# Patient Record
Sex: Male | Born: 1950 | Race: White | Hispanic: No | Marital: Married | Smoking: Never smoker
Health system: Southern US, Community
[De-identification: ages and names within clinical notes are randomized; demographics above are authoritative.]

## PROBLEM LIST (undated history)

## (undated) DIAGNOSIS — N289 Disorder of kidney and ureter, unspecified: Secondary | ICD-10-CM

## (undated) DIAGNOSIS — H409 Unspecified glaucoma: Secondary | ICD-10-CM

## (undated) DIAGNOSIS — C801 Malignant (primary) neoplasm, unspecified: Secondary | ICD-10-CM

## (undated) DIAGNOSIS — E079 Disorder of thyroid, unspecified: Secondary | ICD-10-CM

## (undated) DIAGNOSIS — I10 Essential (primary) hypertension: Secondary | ICD-10-CM

## (undated) HISTORY — PX: PARATHYROIDECTOMY: SHX19

## (undated) HISTORY — PX: NEPHRECTOMY: SHX65

---

## 2005-05-12 ENCOUNTER — Ambulatory Visit: Payer: Self-pay | Admitting: General Surgery

## 2012-03-13 ENCOUNTER — Emergency Department: Payer: Self-pay | Admitting: Internal Medicine

## 2012-03-13 LAB — CBC
HCT: 48.5 % (ref 40.0–52.0)
HGB: 16.6 g/dL (ref 13.0–18.0)
MCV: 89 fL (ref 80–100)
Platelet: 250 10*3/uL (ref 150–440)
RBC: 5.45 10*6/uL (ref 4.40–5.90)
RDW: 13.8 % (ref 11.5–14.5)

## 2012-03-13 LAB — URINALYSIS, COMPLETE
Bilirubin,UR: NEGATIVE
Ketone: NEGATIVE
Leukocyte Esterase: NEGATIVE
Nitrite: NEGATIVE
Ph: 6 (ref 4.5–8.0)
Protein: 100
RBC,UR: 449 /HPF (ref 0–5)
Specific Gravity: 1.027 (ref 1.003–1.030)
Squamous Epithelial: NONE SEEN

## 2012-03-13 LAB — BASIC METABOLIC PANEL
Anion Gap: 9 (ref 7–16)
Calcium, Total: 10.4 mg/dL — ABNORMAL HIGH (ref 8.5–10.1)
Chloride: 108 mmol/L — ABNORMAL HIGH (ref 98–107)
Co2: 22 mmol/L (ref 21–32)
Osmolality: 284 (ref 275–301)
Potassium: 3.9 mmol/L (ref 3.5–5.1)

## 2012-03-19 ENCOUNTER — Emergency Department: Payer: Self-pay | Admitting: Emergency Medicine

## 2014-02-01 IMAGING — CT CT STONE STUDY
1 of 2 series · 15 of 32 positions shown, 19 images · non-contrast
Comparison: None

REASON FOR EXAM: abd pain
COMMENTS:

PROCEDURE:     CT  - CT ABDOMEN /PELVIS WO (STONE)  - March 13, 2012 [DATE]
RESULT:     Indication: Flank Pain
TECHNIQUE: Multiple axial images from the lung bases to the symphysis pubis
were obtained without oral and without intravenous contrast.

[Series 2: 3mm soft tissue · axial · 0.71mm/px · z∈[-1198,-754]mm · 15 of 162 slices shown, 19 images]
[im 7/162  soft-tissue]
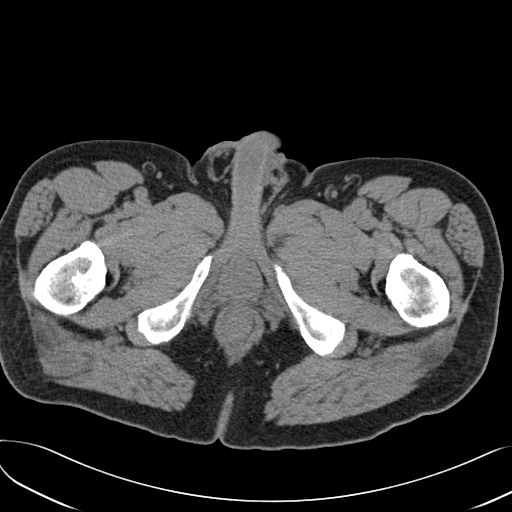
[im 7/162  bone]
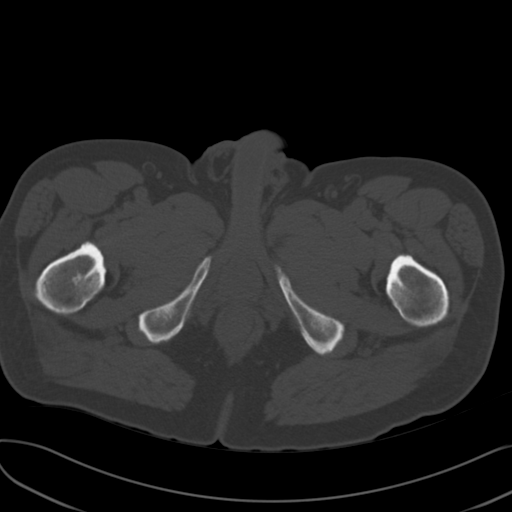
[im 20/162  soft-tissue]
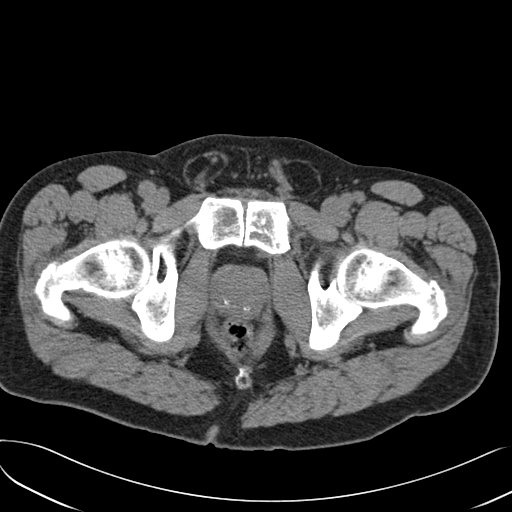
[im 33/162  soft-tissue]
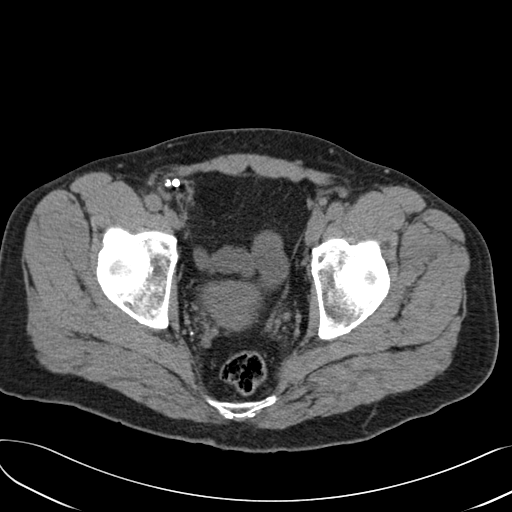
[im 46/162  soft-tissue]
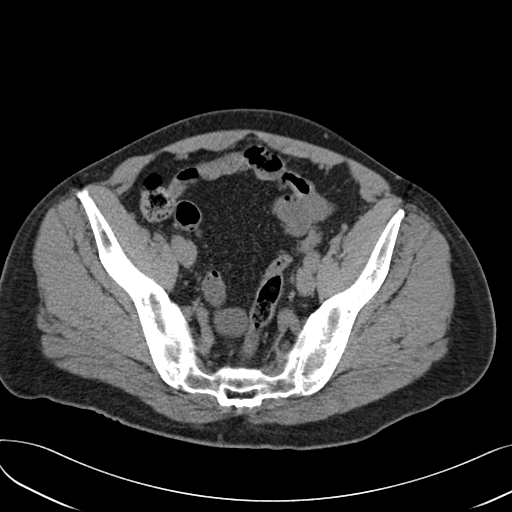
[im 58/162  soft-tissue]
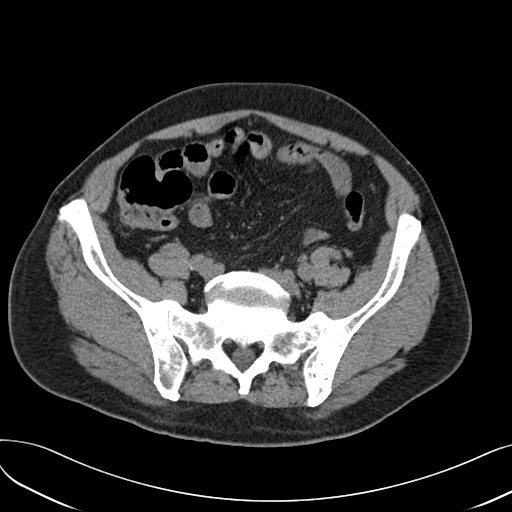
[im 71/162  soft-tissue]
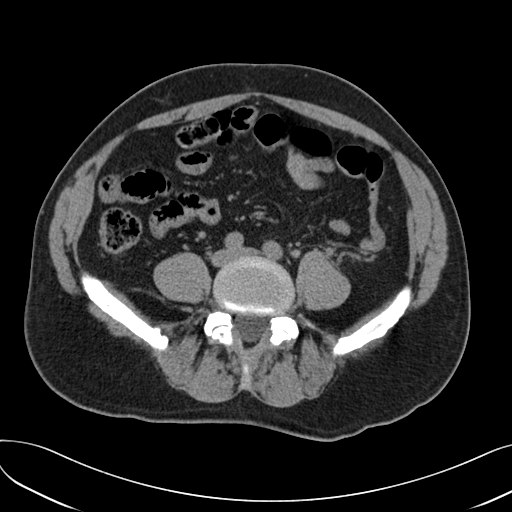
[im 84/162  soft-tissue]
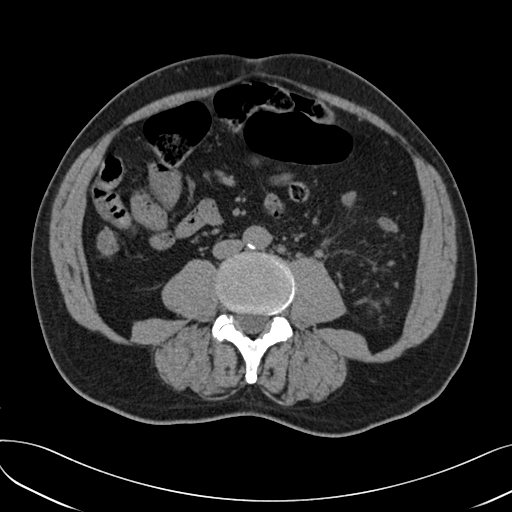
[im 91/162  soft-tissue]
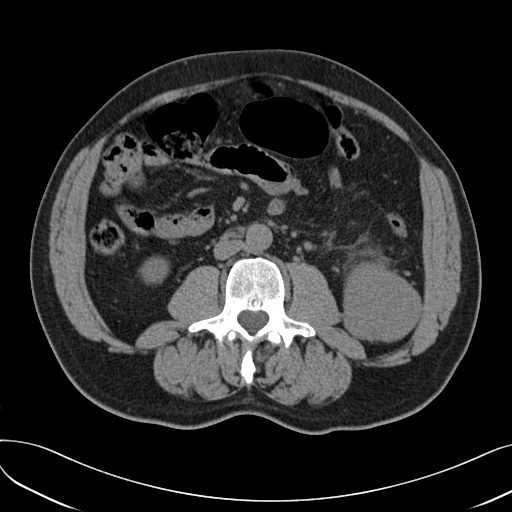
[im 104/162  soft-tissue]
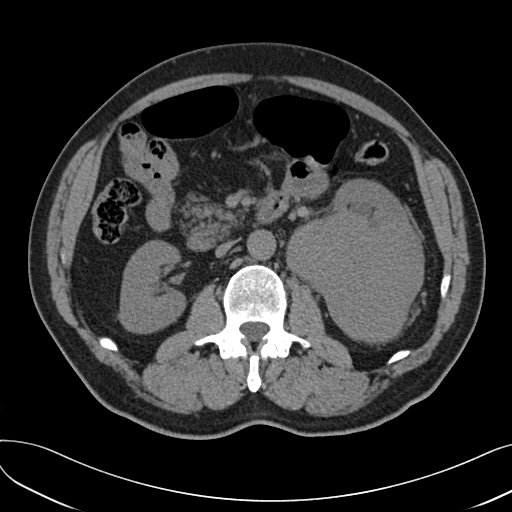
[im 104/162  bone]
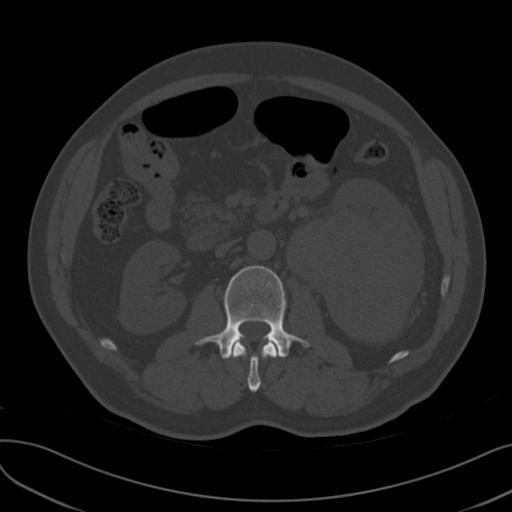
[im 116/162  soft-tissue]
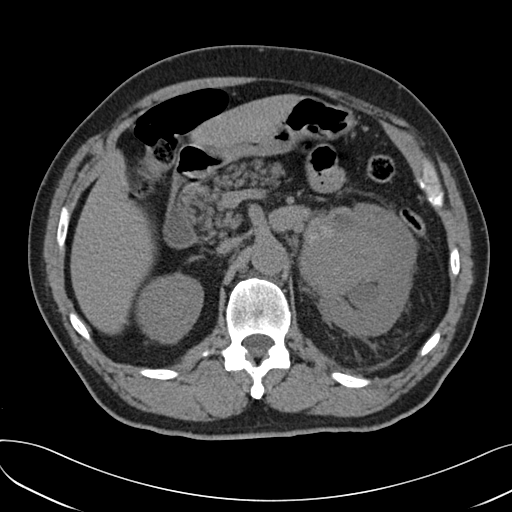
[im 129/162  soft-tissue]
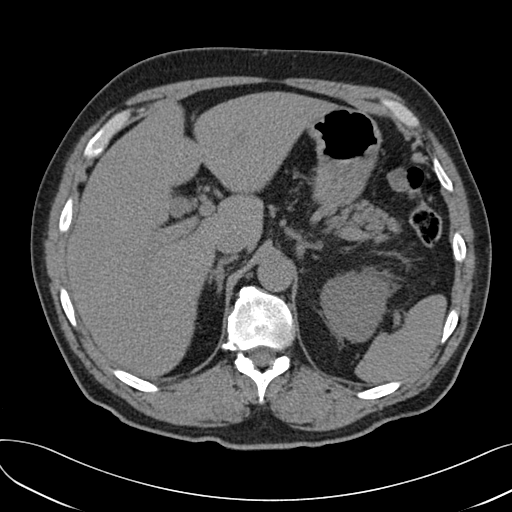
[im 136/162  lung]
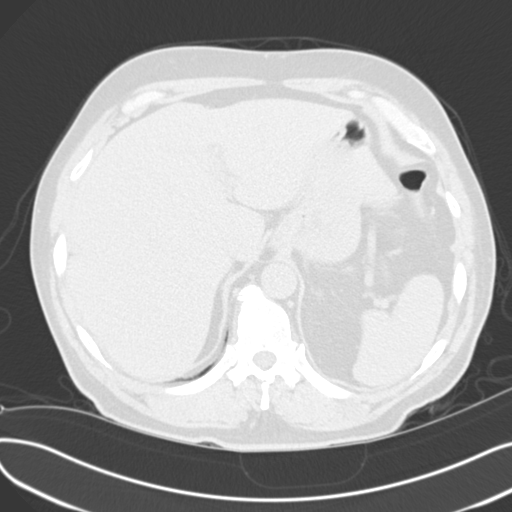
[im 142/162  soft-tissue]
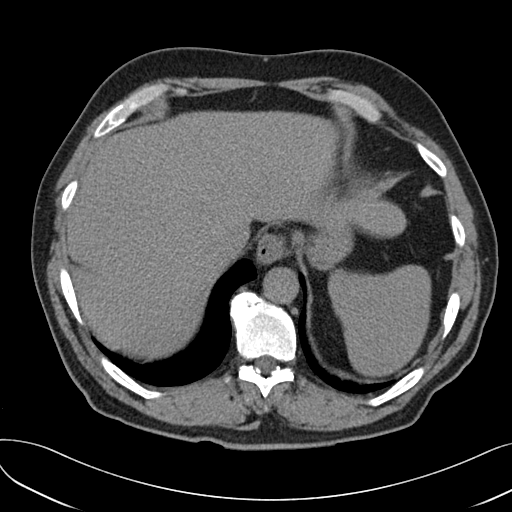
[im 142/162  lung]
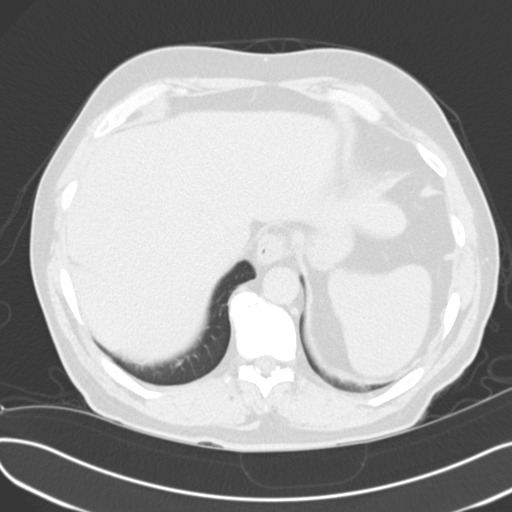
[im 149/162  lung]
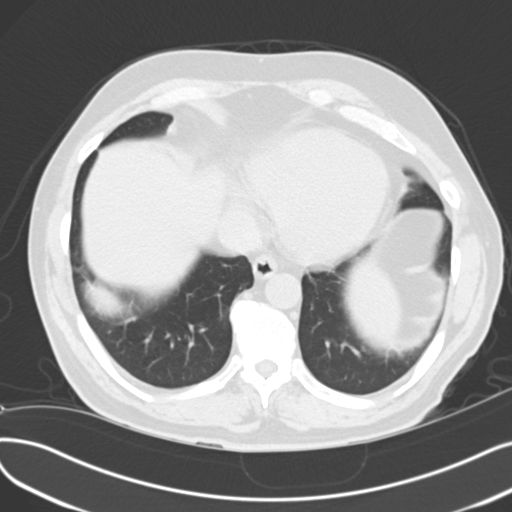
[im 155/162  soft-tissue]
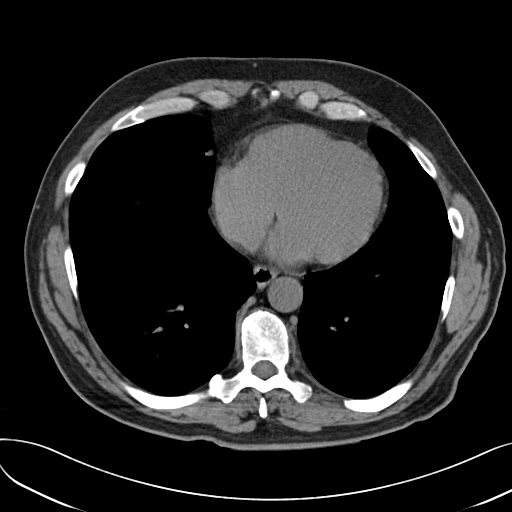
[im 155/162  lung]
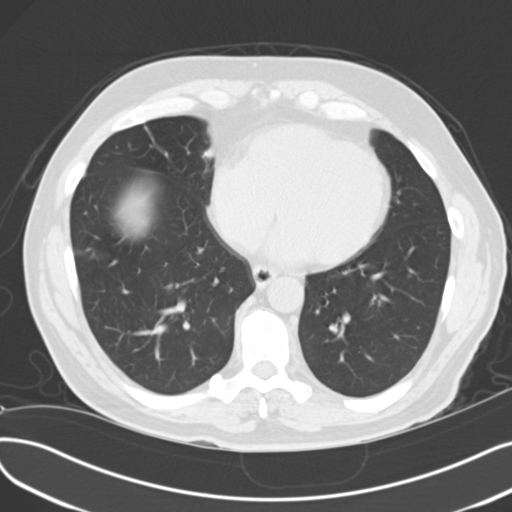

[15 of 32 positions shown; findings below may reference images not displayed]

FINDINGS: The lung bases are clear. There is no pleural or pericardial effusions.

There is left perinephric stranding. There is a 5.3 cm mass in the left
renal pelvis. There is resulting severe left hydronephrosis. There is no
urolithiasis. The bladder is unremarkable.

The liver demonstrates no focal abnormality. The gallbladder is
unremarkable. The spleen demonstrates no focal abnormality. The adrenal
glands and pancreas are normal.

The unopacified stomach, duodenum, small intestine, and large intestine are
unremarkable, but evaluation is limited by lack of oral contrast.  There is
no pneumoperitoneum, pneumatosis, or portal venous gas. There is no
abdominal or pelvic free fluid. There is no lymphadenopathy.

The abdominal aorta is normal in caliber with atherosclerosis.

The osseous structures are unremarkable.
IMPRESSION: 1. Large the left renal mass which may be arising from the renal cortex
versus renal pelvis. Differential considerations include renal cell
carcinoma versus transitional cell carcinoma. Recommend urology consultation.

[REDACTED]

## 2014-02-07 IMAGING — CR DG ABDOMEN 3V
1 series · 5 of 5 positions shown · non-contrast
Comparison: none

REASON FOR EXAM: abd pain
COMMENTS:

[Series 1: w chest pa · 0.14mm/px · 5 of 5 slices shown]
[im 1/5]
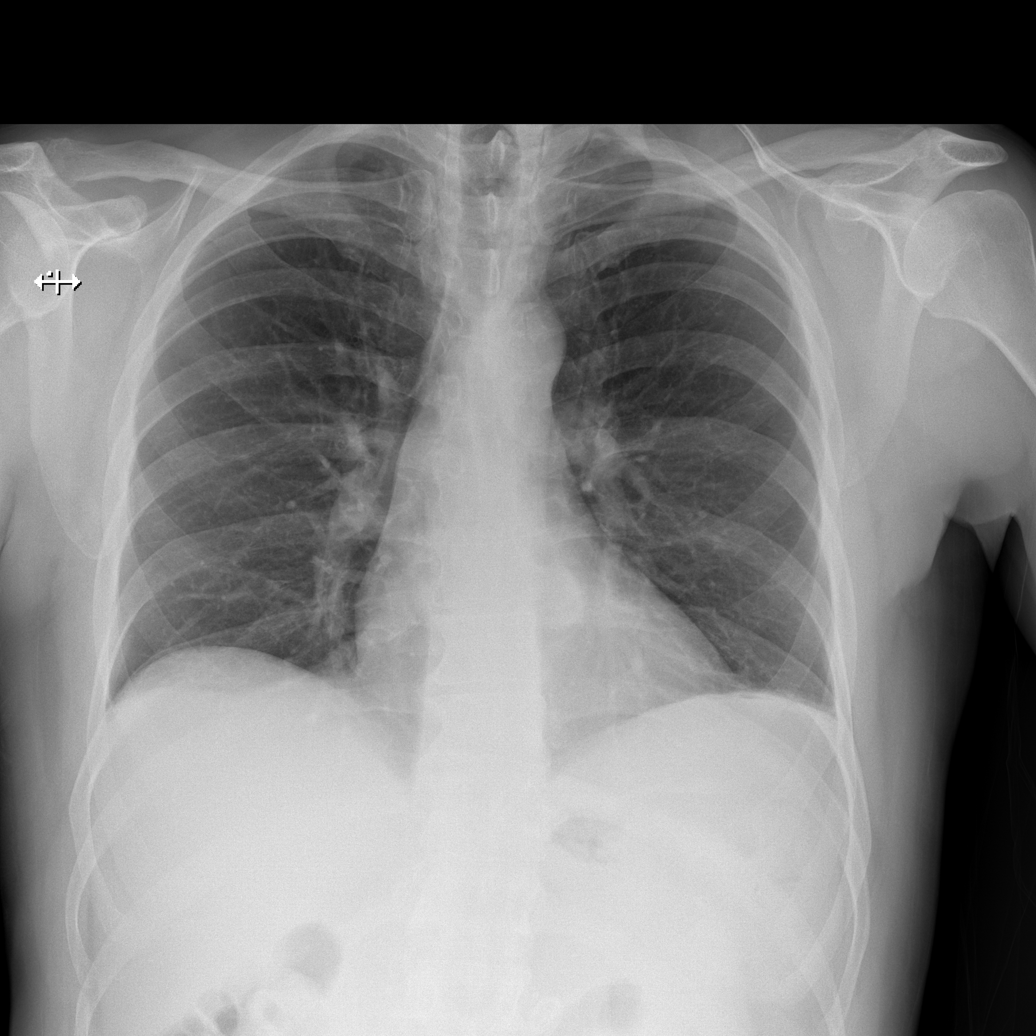
[im 2/5]
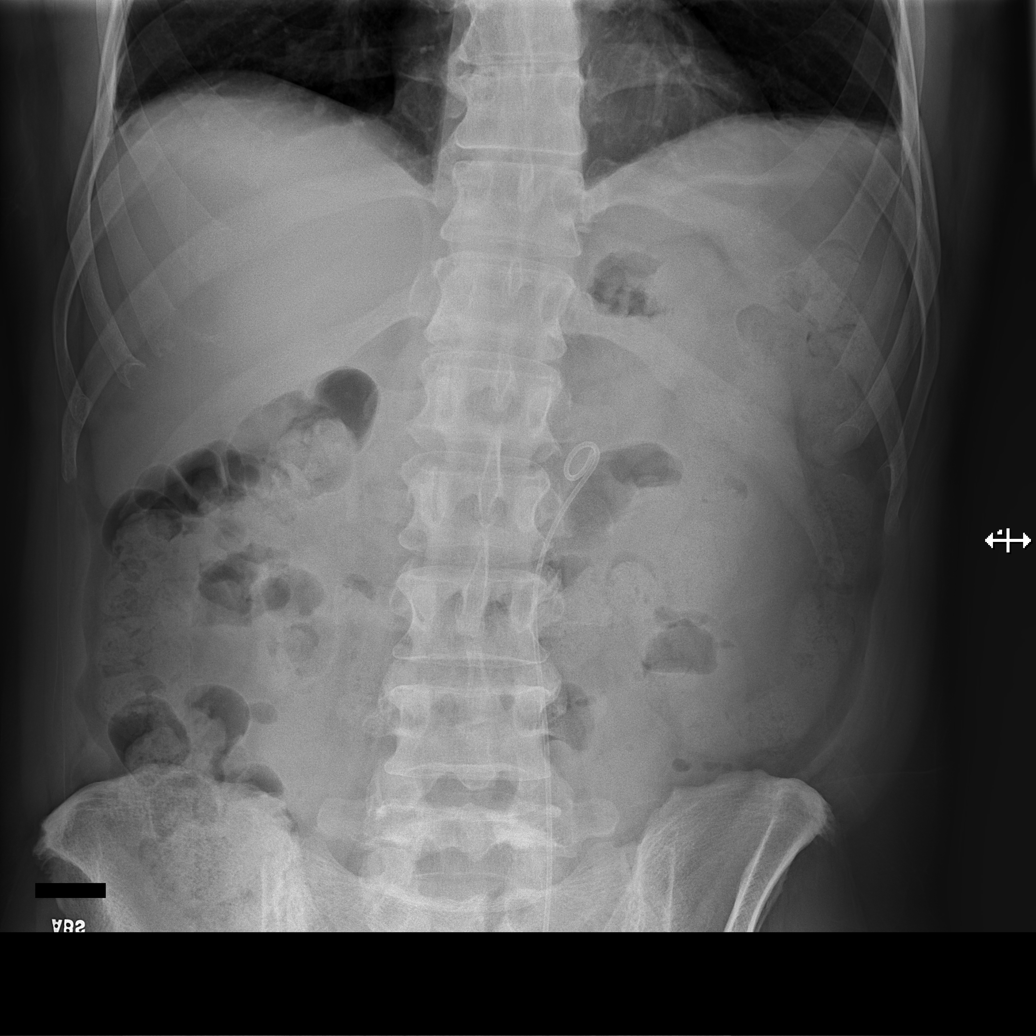
[im 3/5]
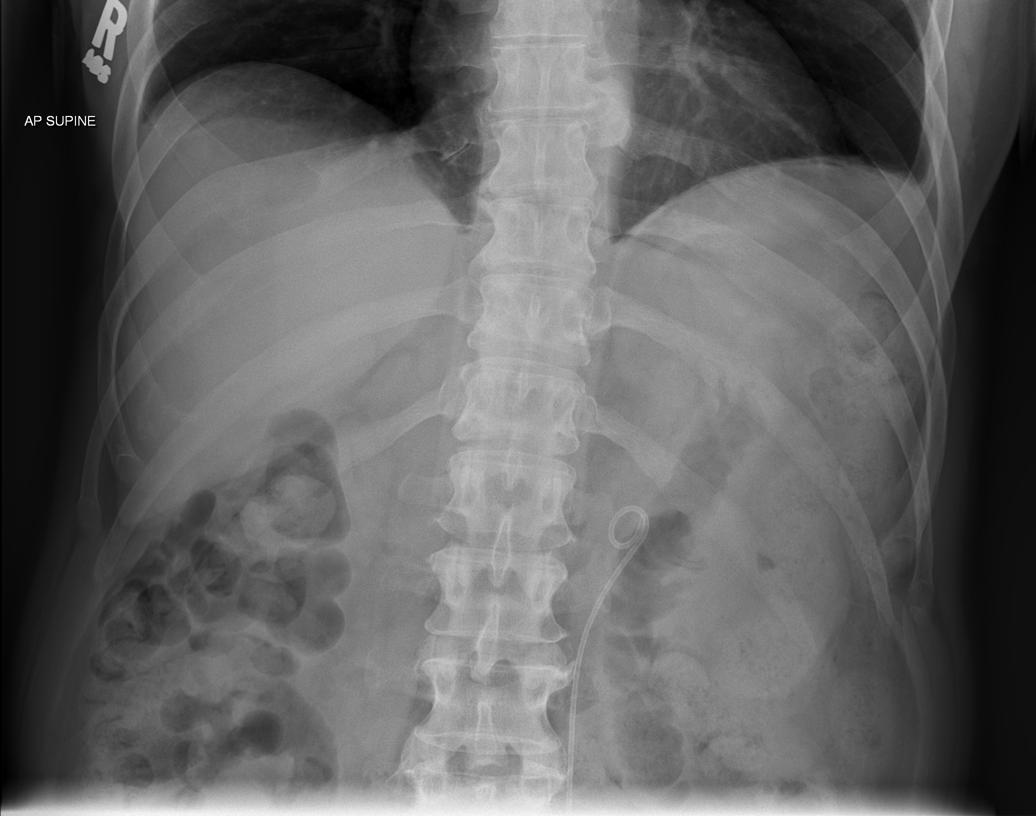
[im 4/5]
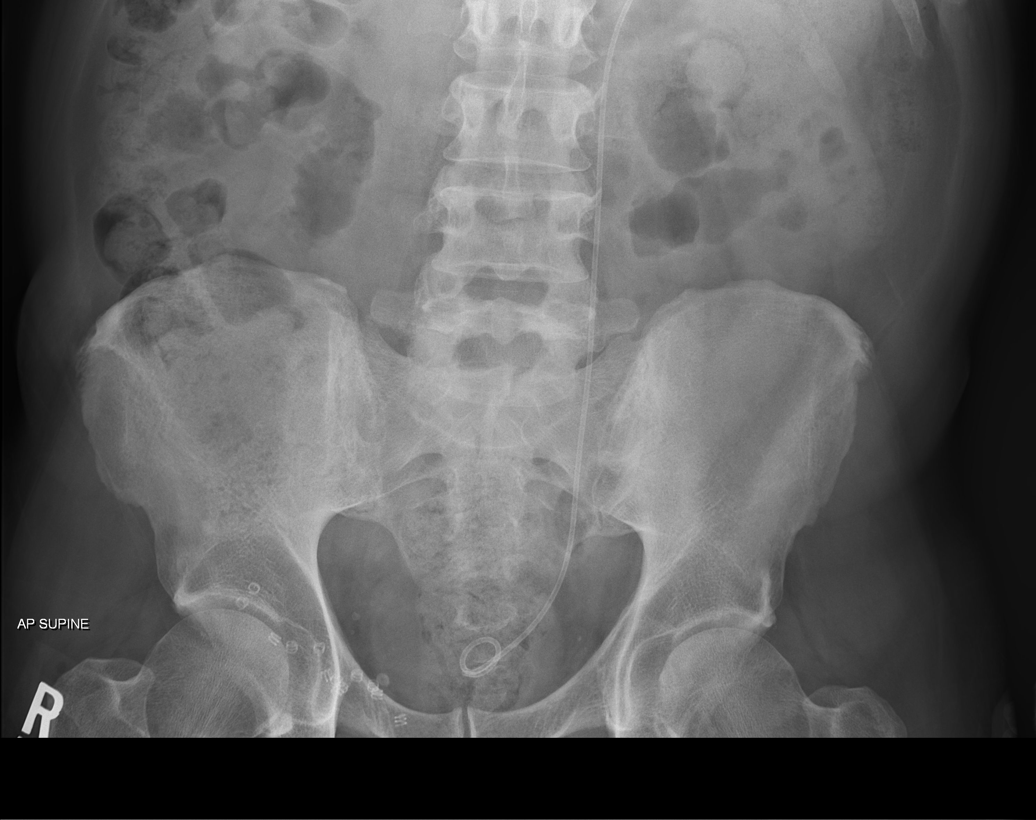
[im 5/5]
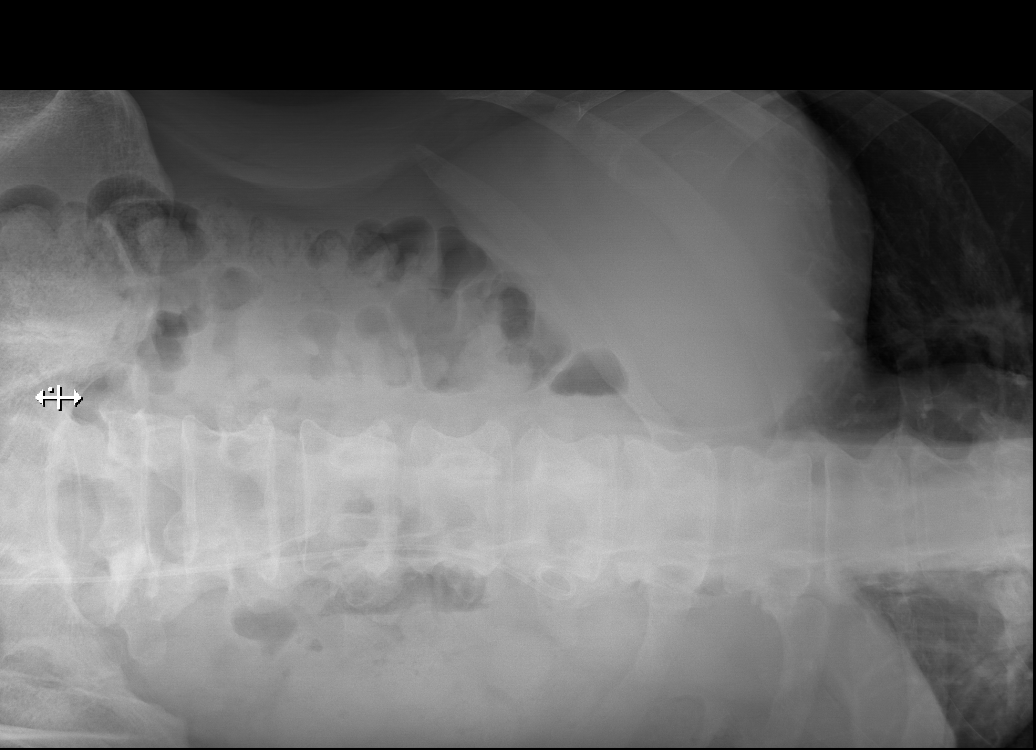

[5 of 5 positions shown; findings below may reference images not displayed]

PROCEDURE:     DXR - DXR ABDOMEN 3-WAY (INCL PA CXR)  - March 19, 2012  [DATE]

RESULT:     The lungs are clear. The heart and pulmonary vessels are normal.
There does not appear to be significant gastric distention. A double-J left
ureteral stent is in position. There is a moderate amount of air and fecal
material scattered through the colon to the rectum. There is air within
slightly prominent loops of small bowel with some air-fluid levels. No free
air is evident. Bony structures are unremarkable. There is a calcific
density along the course of the left ureteral stent at the L5 level which
could represent a portion of the spine. A left ureteral stone is not
completely excluded.
IMPRESSION: Air seen within loops of small bowel with a few air-fluid
levels present. Findings are concerning most likely for ileus. An early
partial small bowel obstruction is not excluded. Clinical and imaging
followup would be recommended.

[REDACTED]

## 2017-10-19 ENCOUNTER — Other Ambulatory Visit
Admission: RE | Admit: 2017-10-19 | Discharge: 2017-10-19 | Disposition: A | Discharge status: Home / Self Care | Payer: Managed Care, Other (non HMO) | Source: Ambulatory Visit | Attending: Pediatrics | Admitting: Pediatrics

## 2017-10-19 ENCOUNTER — Ambulatory Visit
Admission: RE | Admit: 2017-10-19 | Discharge: 2017-10-19 | Disposition: A | Discharge status: Home / Self Care | Payer: Managed Care, Other (non HMO) | Source: Ambulatory Visit | Attending: Family Medicine | Admitting: Family Medicine

## 2017-10-19 ENCOUNTER — Other Ambulatory Visit: Payer: Self-pay | Admitting: Family Medicine

## 2017-10-19 DIAGNOSIS — M7989 Other specified soft tissue disorders: Secondary | ICD-10-CM | POA: Diagnosis not present

## 2017-10-19 LAB — FIBRIN DERIVATIVES D-DIMER (ARMC ONLY): Fibrin derivatives D-dimer (ARMC): 834.5 ng/mL (FEU) — ABNORMAL HIGH (ref 0.00–499.00)

## 2018-09-29 ENCOUNTER — Other Ambulatory Visit: Payer: Self-pay

## 2018-09-29 ENCOUNTER — Emergency Department: Payer: Managed Care, Other (non HMO)

## 2018-09-29 ENCOUNTER — Encounter: Payer: Self-pay | Admitting: Emergency Medicine

## 2018-09-29 ENCOUNTER — Emergency Department
Admission: EM | Admit: 2018-09-29 | Discharge: 2018-09-29 | Disposition: A | Discharge status: Home / Self Care | Payer: Managed Care, Other (non HMO) | Attending: Emergency Medicine | Admitting: Emergency Medicine

## 2018-09-29 DIAGNOSIS — I10 Essential (primary) hypertension: Secondary | ICD-10-CM | POA: Insufficient documentation

## 2018-09-29 DIAGNOSIS — E860 Dehydration: Secondary | ICD-10-CM | POA: Diagnosis not present

## 2018-09-29 DIAGNOSIS — R109 Unspecified abdominal pain: Secondary | ICD-10-CM | POA: Diagnosis not present

## 2018-09-29 DIAGNOSIS — R079 Chest pain, unspecified: Secondary | ICD-10-CM | POA: Insufficient documentation

## 2018-09-29 HISTORY — DX: Unspecified glaucoma: H40.9

## 2018-09-29 HISTORY — DX: Disorder of kidney and ureter, unspecified: N28.9

## 2018-09-29 HISTORY — DX: Malignant (primary) neoplasm, unspecified: C80.1

## 2018-09-29 HISTORY — DX: Disorder of thyroid, unspecified: E07.9

## 2018-09-29 HISTORY — DX: Essential (primary) hypertension: I10

## 2018-09-29 LAB — COMPREHENSIVE METABOLIC PANEL
ALT: 20 U/L (ref 0–44)
AST: 16 U/L (ref 15–41)
Albumin: 3.6 g/dL (ref 3.5–5.0)
Alkaline Phosphatase: 97 U/L (ref 38–126)
Anion gap: 8 (ref 5–15)
BUN: 25 mg/dL — ABNORMAL HIGH (ref 8–23)
CO2: 24 mmol/L (ref 22–32)
Calcium: 6.7 mg/dL — ABNORMAL LOW (ref 8.9–10.3)
Chloride: 106 mmol/L (ref 98–111)
Creatinine, Ser: 1.35 mg/dL — ABNORMAL HIGH (ref 0.61–1.24)
GFR calc Af Amer: >60 mL/min (ref >60)
GFR calc non Af Amer: 54 mL/min — ABNORMAL LOW (ref >60)
Glucose, Bld: 118 mg/dL — ABNORMAL HIGH (ref 70–99)
Potassium: 3.6 mmol/L (ref 3.5–5.1)
Sodium: 138 mmol/L (ref 135–145)
Total Bilirubin: 0.8 mg/dL (ref 0.3–1.2)
Total Protein: 7.4 g/dL (ref 6.5–8.1)

## 2018-09-29 LAB — CBC WITH DIFFERENTIAL/PLATELET
Abs Immature Granulocytes: 0.06 10*3/uL (ref 0.00–0.07)
Basophils Absolute: 0 10*3/uL (ref 0.0–0.1)
Basophils Relative: 0 %
Eosinophils Absolute: 0.1 10*3/uL (ref 0.0–0.5)
Eosinophils Relative: 1 %
HCT: 43.7 % (ref 39.0–52.0)
Hemoglobin: 14.7 g/dL (ref 13.0–17.0)
Immature Granulocytes: 1 %
Lymphocytes Relative: 7 %
Lymphs Abs: 0.7 10*3/uL (ref 0.7–4.0)
MCH: 30.3 pg (ref 26.0–34.0)
MCHC: 33.6 g/dL (ref 30.0–36.0)
MCV: 90.1 fL (ref 80.0–100.0)
Monocytes Absolute: 0.9 10*3/uL (ref 0.1–1.0)
Monocytes Relative: 8 %
Neutro Abs: 9.4 10*3/uL — ABNORMAL HIGH (ref 1.7–7.7)
Neutrophils Relative %: 83 %
Platelets: 184 10*3/uL (ref 150–400)
RBC: 4.85 MIL/uL (ref 4.22–5.81)
RDW: 14 % (ref 11.5–15.5)
WBC: 11.3 10*3/uL — ABNORMAL HIGH (ref 4.0–10.5)
nRBC: 0 % (ref 0.0–0.2)

## 2018-09-29 LAB — TROPONIN I (HIGH SENSITIVITY)
Troponin I (High Sensitivity): 10 ng/L (ref <18)
Troponin I (High Sensitivity): 10 ng/L (ref <18)

## 2018-09-29 LAB — LIPASE, BLOOD: Lipase: 22 U/L (ref 11–51)

## 2018-09-29 MED ORDER — IOHEXOL 350 MG/ML SOLN
100.0000 mL | Freq: Once | INTRAVENOUS | Status: AC | PRN
  Administered 2018-09-29: 10:00:00 100 mL via INTRAVENOUS

## 2018-09-29 MED ORDER — ONDANSETRON HCL 4 MG/2ML IJ SOLN
4.0000 mg | Freq: Once | INTRAMUSCULAR | Status: AC
  Administered 2018-09-29: 11:00:00 4 mg via INTRAVENOUS
  Filled 2018-09-29: qty 2

## 2018-09-29 MED ORDER — SODIUM CHLORIDE 0.9 % IV BOLUS
1000.0000 mL | Freq: Once | INTRAVENOUS | Status: AC
  Administered 2018-09-29: 1000 mL via INTRAVENOUS

## 2018-09-29 MED ORDER — MORPHINE SULFATE (PF) 4 MG/ML IV SOLN
4.0000 mg | Freq: Once | INTRAVENOUS | Status: AC
  Administered 2018-09-29: 11:00:00 4 mg via INTRAVENOUS
  Filled 2018-09-29: qty 1

## 2018-09-29 NOTE — ED Provider Notes (Signed)
[facility] Emergency Department Provider Note  Time seen: 7:58 AM  I have reviewed the triage vital signs and the nursing notes.   HISTORY  Chief Complaint Constipation and Dehydration   HPI Luke Holland is a 68 y.o. male with a past medical history of hypertension, recent thyroid surgery 3 days ago presents to the emergency department for loose stool and feeling dehydrated.  According to the patient since yesterday he has been experiencing loose stool, states he has not been eating or drinking much since his surgery 3 days ago and is concerned he could be dehydrated.  Patient also states some pain in his left upper abdomen/left lower chest which started yesterday as well.  Denies any fever cough or shortness of breath.  Denies any nausea or vomiting.  Patient states initially he was somewhat constipated although since yesterday he has been experiencing loose stool.   Past Medical History:  Diagnosis Date  . Cancer [facility])    renal cancer  . Glaucoma   . Hypertension   . 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 Thyroid disease     There are no active problems to display for this patient.   Past Surgical History:  Procedure Laterality Date  . NEPHRECTOMY    . PARATHYROIDECTOMY      Prior to Admission medications   Not on File    No Known Allergies  No family history on file.  Social History Social History   Tobacco Use  . Smoking status: Never Smoker  . Smokeless tobacco: Never Used  Substance Use Topics  . Alcohol use: Not on file  . Drug use: Not on file    Review of Systems Constitutional: Negative for fever Cardiovascular: Mild left lower chest pain Respiratory: Negative for shortness of breath. Gastrointestinal: Mild left upper quadrant abdominal pain/left lower chest pain Genitourinary: Negative for urinary compaints Musculoskeletal: Negative for musculoskeletal complaints Skin: Negative for skin complaints   Neurological: Negative for headache All other ROS negative  ____________________________________________   PHYSICAL EXAM:  VITAL SIGNS: ED Triage Vitals  Enc Vitals Group     BP 09/29/18 0438 (!) 162/87     Pulse Rate 09/29/18 0438 85     Resp 09/29/18 0438 20     Temp 09/29/18 0438 98.9 F (37.2 C)     Temp Source 09/29/18 0438 Oral     SpO2 09/29/18 0438 95 %     Weight 09/29/18 0434 199 lb (90.3 kg)     Height 09/29/18 0434 5\' 8"  (1.727 m)     Head Circumference --      Peak Flow --      Pain Score 09/29/18 0434 7     Pain Loc --      Pain Edu? --      Excl. in [place]? --    Constitutional: Alert and oriented. Well appearing and in no distress. Eyes: Normal exam ENT      Head: Normocephalic and atraumatic      Mouth/Throat: Mucous membranes are moist. Cardiovascular: Normal rate, regular rhythm.  Respiratory: Normal respiratory effort without tachypnea nor retractions. Breath sounds are clear Gastrointestinal: Soft and nontender. No distention.  Musculoskeletal: Nontender with normal range of motion in all extremities.  Neurologic:  Normal speech and language. No gross focal neurologic deficits  Skin:  Skin is warm, dry and intact.  Psychiatric: Mood and affect are normal.  ____________________________________________    EKG  EKG viewed and  interpreted by myself shows a normal sinus rhythm at 79 bpm with a narrow QRS, normal axis, normal intervals, no concerning ST changes.  Repeat EKG viewed and interpreted by myself shows a normal sinus rhythm 85 bpm with a narrow QRS, normal axis, normal intervals, no concerning ST changes.  ____________________________________________   INITIAL IMPRESSION / ASSESSMENT AND PLAN / ED COURSE  Pertinent labs & imaging results that were available during my care of the patient were reviewed by me and considered in my medical decision making (see chart for details).   Patient presents emergency department for left upper quadrant  pain/left lower chest pain along with loose stool and feeling dehydrated.  Overall the patient appears well, no acute distress.  Patient states the pain has resolved completely benign abdominal exam denies any chest pain.  Patient's lab work does show a very slight decrease in renal function we will IV hydrate, I have also added on a lipase and troponin to the patient's lab work as well as an EKG as a precaution.  Patient agreeable plan of care.  Patient's work-up is largely nonrevealing.  Troponin is negative.  Lipase negative.  Patient states the pain is returned and is worse in the left chest into the abdomen.  Repeat EKG continues to show no findings.  We will repeat a troponin we will proceed with CT imaging of the chest abdomen and pelvis to rule out dissection as the patient is hypertensive 187/90.  We will treat the patient's pain while awaiting CT results.  CT scan is negative.  Troponin x2 is negative.  Patient appears well.  No distress at this time.  Given a negative work-up I believe the patient is safe for discharge home with PCP follow-up.  Luke Holland was evaluated in Emergency Department on 09/29/2018 for the symptoms described in the history of present illness. He was evaluated in the context of the global COVID-19 pandemic, which necessitated consideration that the patient might be at risk for infection with the SARS-CoV-2 virus that causes COVID-19. Institutional protocols and algorithms that pertain to the evaluation of patients at risk for COVID-19 are in a state of rapid change based on information released by regulatory bodies including the CDC and federal and state organizations. These policies and algorithms were followed during the patient's care in the ED.  ____________________________________________   FINAL CLINICAL IMPRESSION(S) / ED DIAGNOSES  Dehydration Abdominal pain Chest pain   Harvest Dark, MD 09/29/18 1206

## 2018-09-29 NOTE — ED Notes (Signed)
EDP at bedside  

## 2018-09-29 NOTE — ED Notes (Signed)
Pt given ice pack for L knee

## 2018-09-29 NOTE — ED Notes (Signed)
Pt up to use bathroom 

## 2018-09-29 NOTE — ED Notes (Signed)
Patient transported to CT 

## 2018-09-29 NOTE — ED Triage Notes (Signed)
Patient states that he has part of his parathyroid removed Wednesday. Patient states that he thinks that he is dehydrated because he has not been eating good. Patient states that he is also constipated. He states that his last BM was about 30 minutes ago.

## 2018-09-29 NOTE — ED Notes (Signed)
Pt had surgery to remove parathyroid glands Wednesday- pt having pain in his chest, arms, and legs since last night

## 2018-12-23 IMAGING — US US EXTREM LOW VENOUS*R*
1 series · 13 of 24 positions shown · non-contrast
Comparison: None.

CLINICAL DATA: 67-year-old male with a history of right lower
extremity swelling



[Series 1: us extrem low venous*right* · 0.08mm/px · 13 of 38 slices shown]
[im 1/38]
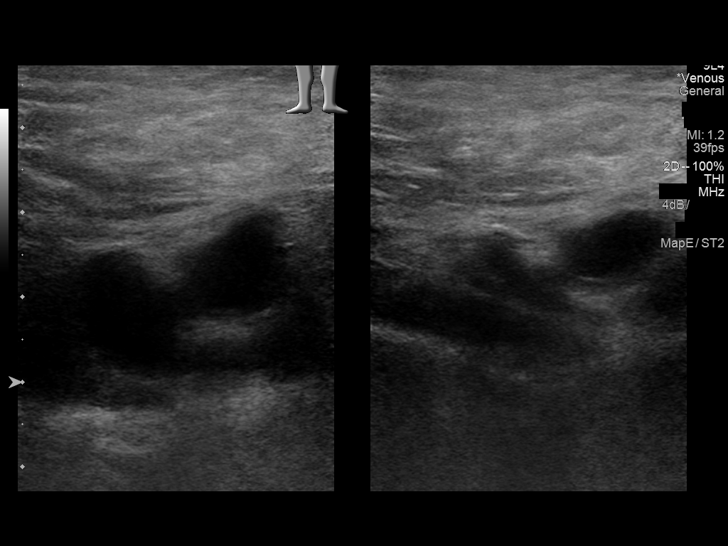
[im 4/38]
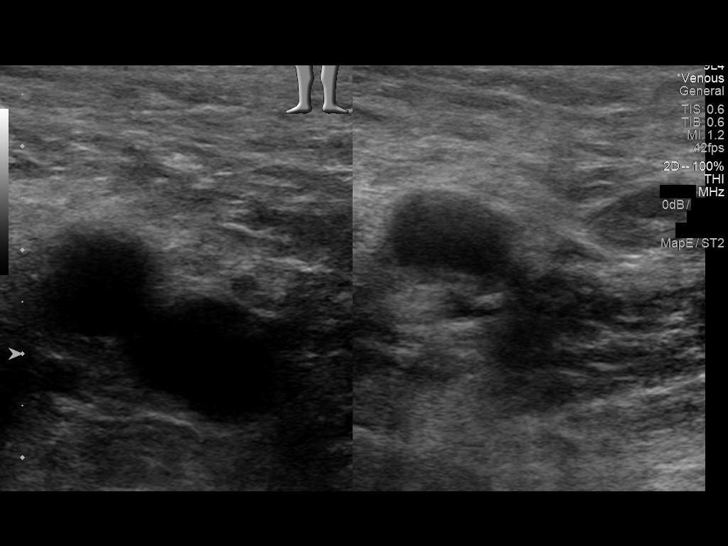
[im 7/38]
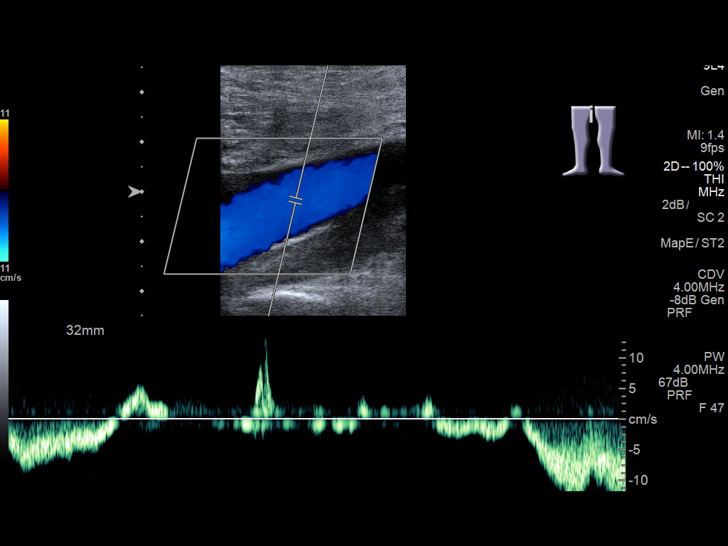
[im 10/38]
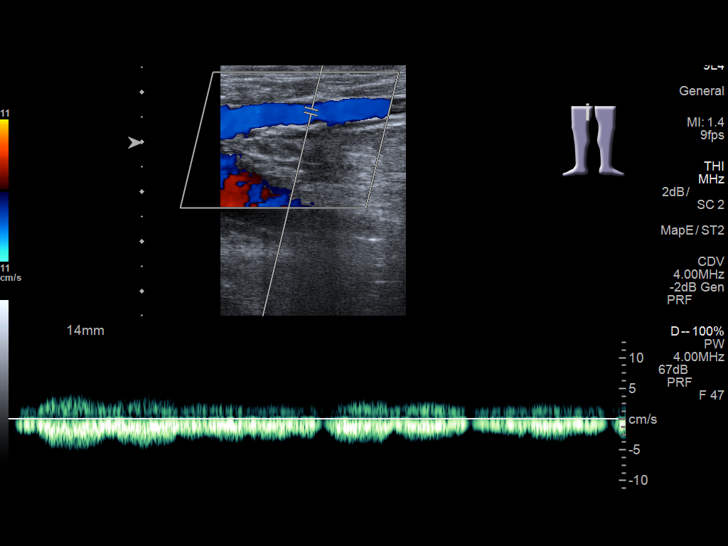
[im 13/38]
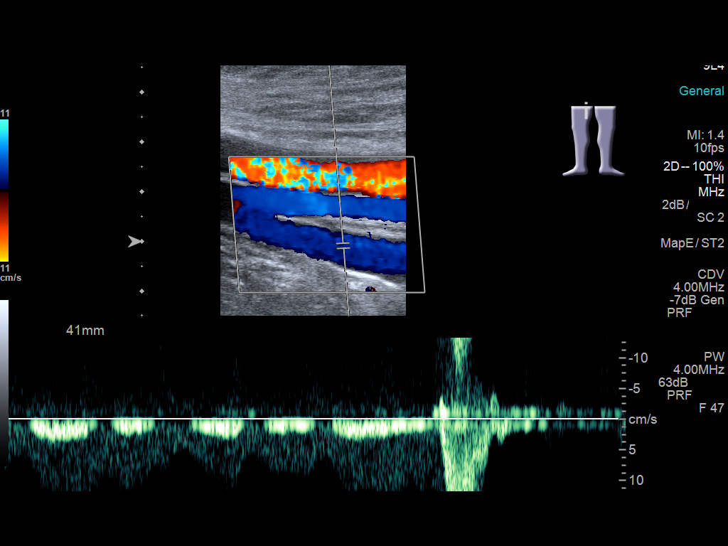
[im 17/38]
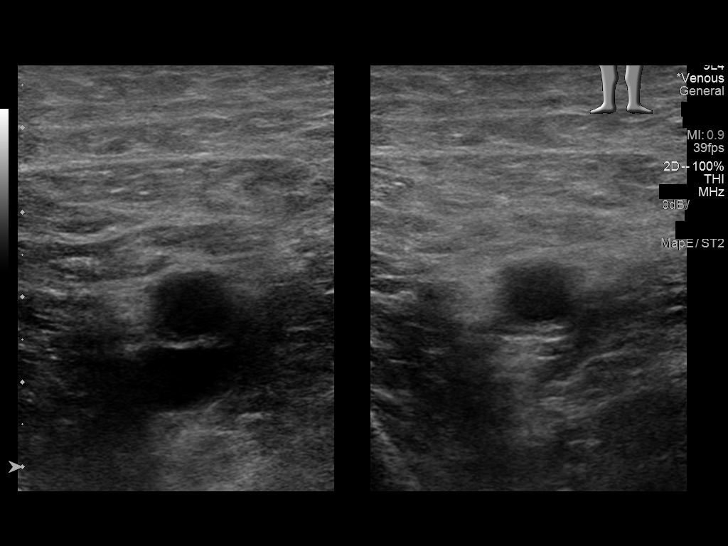
[im 20/38]
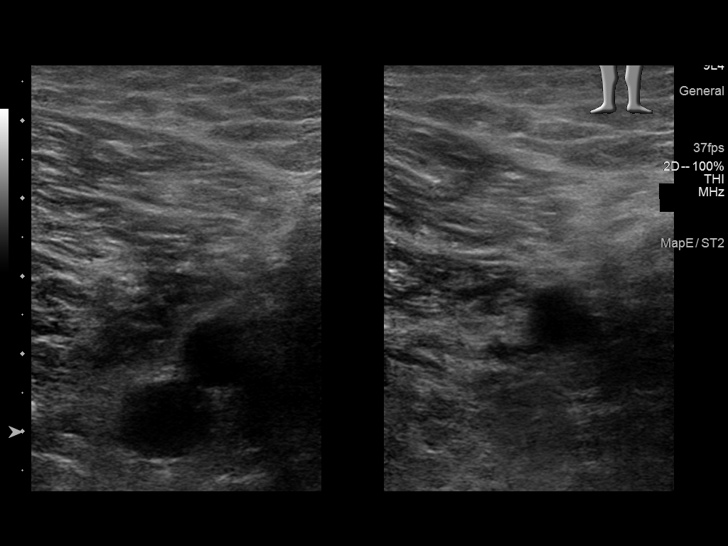
[im 21/38]
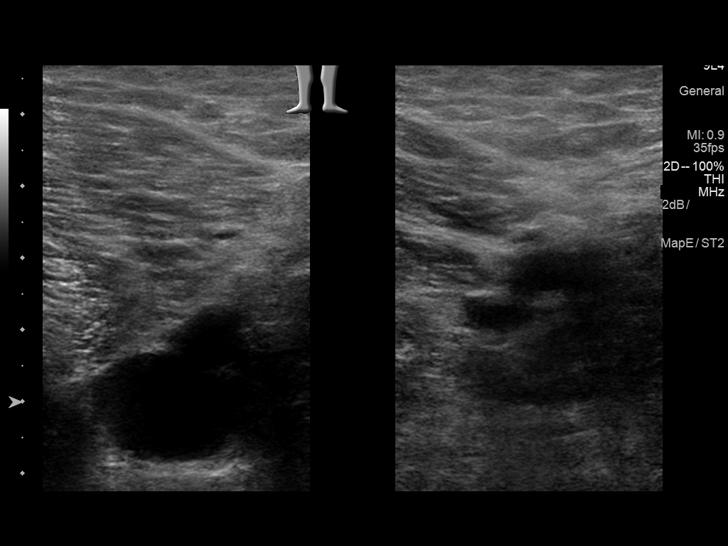
[im 25/38]
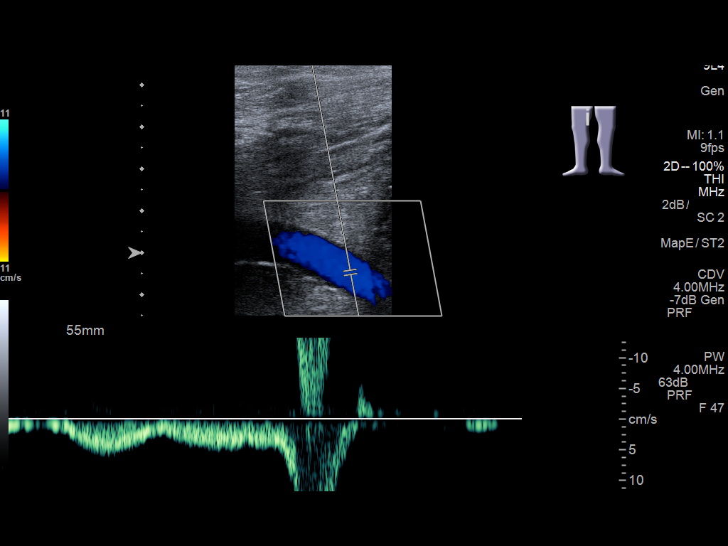
[im 28/38]
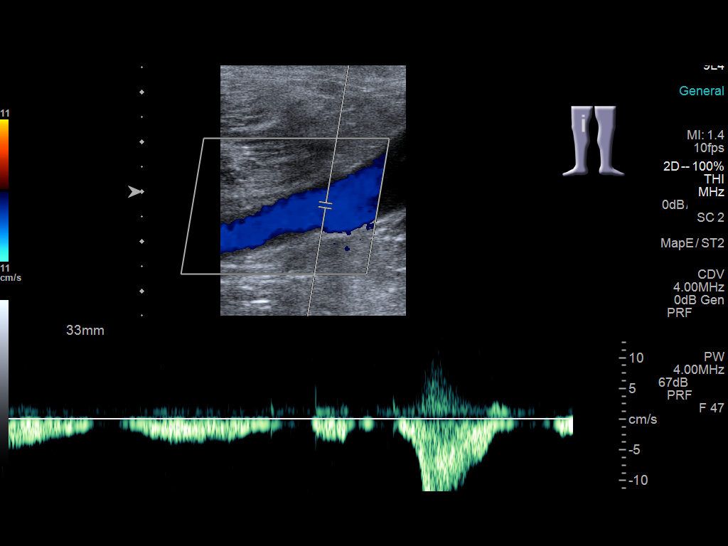
[im 31/38]
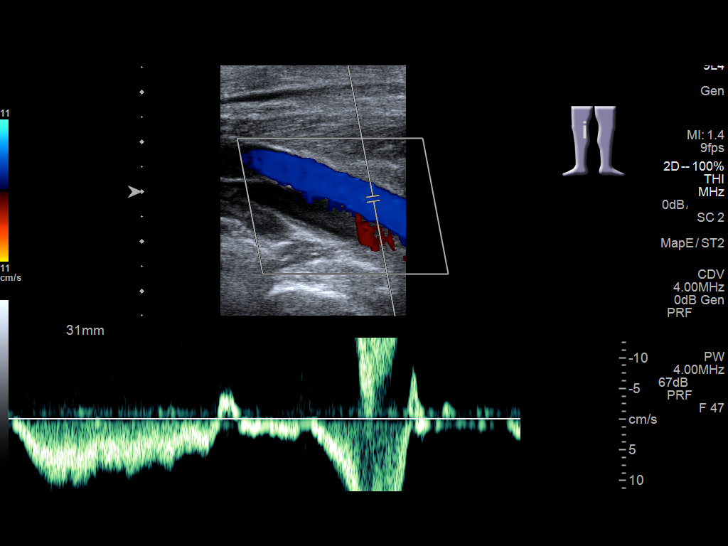
[im 34/38]
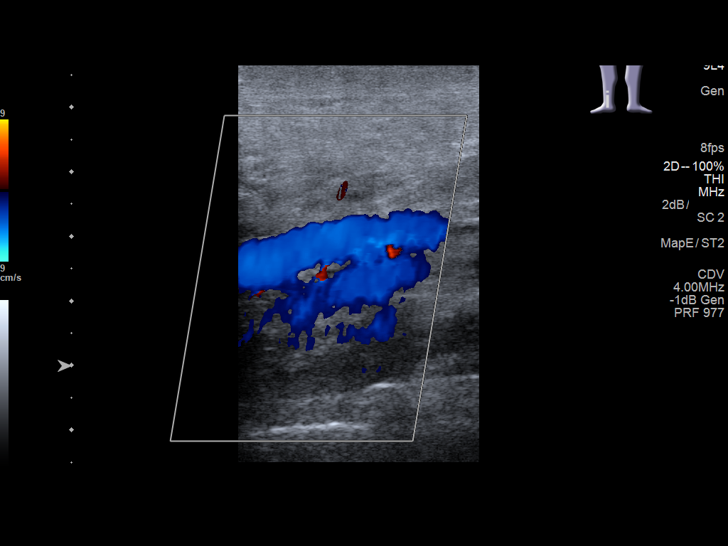
[im 38/38]
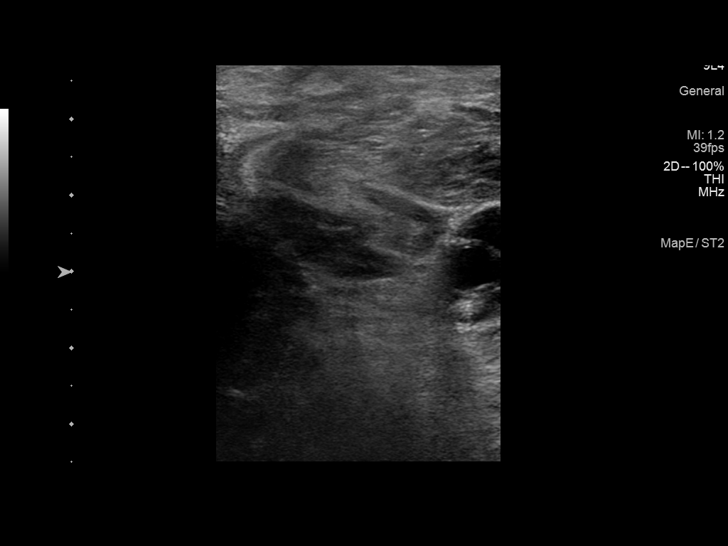

[13 of 24 positions shown; findings below may reference images not displayed]

FINDINGS: Contralateral Common Femoral Vein: Respiratory phasicity is normal
and symmetric with the symptomatic side. No evidence of thrombus.
Normal compressibility.

Common Femoral Vein: No evidence of thrombus. Normal
compressibility, respiratory phasicity and response to augmentation.

Saphenofemoral Junction: No evidence of thrombus. Normal
compressibility and flow on color Doppler imaging.

Profunda Femoral Vein: No evidence of thrombus. Normal
compressibility and flow on color Doppler imaging.

Femoral Vein: No evidence of thrombus. Normal compressibility,
respiratory phasicity and response to augmentation. Ectasia of the
distal femoral vein

Popliteal Vein: No evidence of thrombus. Normal compressibility,
respiratory phasicity and response to augmentation.

Calf Veins: No evidence of thrombus. Normal compressibility and flow
on color Doppler imaging.

Superficial Great Saphenous Vein: No evidence of thrombus. Normal
compressibility and flow on color Doppler imaging.

Venous Reflux:  None.

Other Findings:  None.
IMPRESSION: Sonographic survey of the right lower extremity negative for DVT

## 2020-04-23 ENCOUNTER — Ambulatory Visit: Payer: Managed Care, Other (non HMO) | Admitting: Family Medicine

## 2020-06-11 ENCOUNTER — Encounter: Payer: Self-pay | Admitting: Family Medicine

## 2020-06-11 ENCOUNTER — Ambulatory Visit (INDEPENDENT_AMBULATORY_CARE_PROVIDER_SITE_OTHER): Payer: Managed Care, Other (non HMO) | Admitting: Family Medicine

## 2020-06-11 ENCOUNTER — Other Ambulatory Visit: Payer: Self-pay

## 2020-06-11 VITALS — BP 148/90 | HR 78 | Ht 68.0 in | Wt 205.0 lb

## 2020-06-11 DIAGNOSIS — Z7689 Persons encountering health services in other specified circumstances: Secondary | ICD-10-CM | POA: Diagnosis not present

## 2020-06-11 NOTE — Progress Notes (Signed)
Date:  06/11/2020   Name:  Luke Holland   DOB:  Nov 20, 1950   MRN:  [id number]   Chief Complaint: Establish Care  Patient is a 70 year old male who presents for a establish care exam. The patient reports the following problems: none. Health maintenance has been reviewed up to date.   Lab Results  Component Value Date   CREATININE 1.35 (H) 09/29/2018   BUN 25 (H) 09/29/2018   NA 138 09/29/2018   K 3.6 09/29/2018   CL 106 09/29/2018   CO2 24 09/29/2018   No results found for: CHOL, HDL, LDLCALC, LDLDIRECT, TRIG, CHOLHDL No results found for: TSH No results found for: HGBA1C Lab Results  Component Value Date   WBC 11.3 (H) 09/29/2018   HGB 14.7 09/29/2018   HCT 43.7 09/29/2018   MCV 90.1 09/29/2018   PLT 184 09/29/2018   Lab Results  Component Value Date   ALT 20 09/29/2018   AST 16 09/29/2018   ALKPHOS 97 09/29/2018   BILITOT 0.8 09/29/2018     Review of Systems  Constitutional: Negative for chills and fever.  HENT: Negative for drooling, ear discharge, ear pain and sore throat.   Respiratory: Negative for cough, shortness of breath and wheezing.   Cardiovascular: Negative for chest pain, palpitations and leg swelling.  Gastrointestinal: Negative for abdominal pain, blood in stool, constipation, diarrhea and nausea.  Endocrine: Negative for polydipsia.  Genitourinary: Negative for dysuria, frequency, hematuria and urgency.  Musculoskeletal: Negative for back pain, myalgias and neck pain.  Skin: Negative for rash.  Allergic/Immunologic: Negative for environmental allergies.  Neurological: Negative for dizziness and headaches.  Hematological: Does not bruise/bleed easily.  Psychiatric/Behavioral: Negative for suicidal ideas. The patient is not nervous/anxious.     There are no problems to display for this patient.   No Known Allergies  Past Surgical History:  Procedure Laterality Date  . NEPHRECTOMY    . PARATHYROIDECTOMY      Social History   Tobacco  Use  . Smoking status: Never Smoker  . Smokeless tobacco: Never Used  Vaping Use  . Vaping Use: Never used  Substance Use Topics  . Alcohol use: Never  . Drug use: Never     Medication list has been reviewed and updated.  Current Meds  Medication Sig  . acetaminophen (TYLENOL) 325 MG tablet Take 650 mg by mouth every 6 (six) hours as needed.  Marland Kitchen amLODipine (NORVASC) 10 MG tablet Take by mouth.  Marland Kitchen aspirin 81 MG EC tablet Take 1 tablet by mouth daily.  . brimonidine (ALPHAGAN) 0.2 % ophthalmic solution 1 drop 3 (three) times daily.  . Calcium Carb-Cholecalciferol 600-400 MG-UNIT TABS Take by mouth.  . carboxymethylcellulose (REFRESH PLUS) 0.5 % SOLN Apply to eye.  . cetirizine (ZYRTEC) 10 MG tablet Take by mouth.  . chlorthalidone (HYGROTON) 25 MG tablet Take 1 tablet by mouth daily.  . fluticasone (FLONASE) 50 MCG/ACT nasal spray Place into the nose.  . latanoprost (XALATAN) 0.005 % ophthalmic solution Apply to eye.  Marland Kitchen lisinopril (ZESTRIL) 40 MG tablet Take by mouth.  . montelukast (SINGULAIR) 10 MG tablet Take by mouth.  . timolol (TIMOPTIC) 0.5 % ophthalmic solution Apply to eye.  . [DISCONTINUED] azelastine (ASTELIN) 0.1 % nasal spray Place into the nose.  . [DISCONTINUED] calcitRIOL (ROCALTROL) 0.25 MCG capsule Take by mouth.  . [DISCONTINUED] hydrALAZINE (APRESOLINE) 25 MG tablet   . [DISCONTINUED] ondansetron (ZOFRAN-ODT) 4 MG disintegrating tablet Take by mouth.  . [DISCONTINUED] tiZANidine (ZANAFLEX)  4 MG tablet Take by mouth.    PHQ 2/9 Scores 06/11/2020  PHQ - 2 Score 0  PHQ- 9 Score 2    GAD 7 : Generalized Anxiety Score 06/11/2020  Nervous, Anxious, on Edge 0  Control/stop worrying 0  Worry too much - different things 0  Trouble relaxing 0  Restless 0  Easily annoyed or irritable 0  Afraid - awful might happen 0  Total GAD 7 Score 0    BP Readings from Last 3 Encounters:  06/11/20 (!) 148/90  09/29/18 (!) 154/84    Physical Exam Vitals and nursing note  reviewed.  HENT:     Head: Normocephalic.     Right Ear: Tympanic membrane, ear canal and external ear normal.     Left Ear: Tympanic membrane, ear canal and external ear normal.     Nose: Nose normal. No congestion or rhinorrhea.     Mouth/Throat:     Mouth: Mucous membranes are dry.     Pharynx: No oropharyngeal exudate or posterior oropharyngeal erythema.  Eyes:     General: No scleral icterus.       Right eye: No discharge.        Left eye: No discharge.     Conjunctiva/sclera: Conjunctivae normal.     Pupils: Pupils are equal, round, and reactive to light.  Neck:     Thyroid: No thyromegaly.     Vascular: No JVD.     Trachea: No tracheal deviation.  Cardiovascular:     Rate and Rhythm: Normal rate and regular rhythm.     Heart sounds: Normal heart sounds. No murmur heard. No friction rub. No gallop.   Pulmonary:     Effort: No respiratory distress.     Breath sounds: Normal breath sounds. No stridor. No wheezing, rhonchi or rales.  Chest:     Chest wall: No tenderness.  Abdominal:     General: Bowel sounds are normal.     Palpations: Abdomen is soft. There is no mass.     Tenderness: There is no abdominal tenderness. There is no guarding or rebound.  Musculoskeletal:        General: No tenderness. Normal range of motion.     Cervical back: Normal range of motion and neck supple.  Lymphadenopathy:     Cervical: No cervical adenopathy.  Skin:    General: Skin is warm.     Findings: No rash.  Neurological:     Mental Status: He is alert and oriented to person, place, and time.     Cranial Nerves: No cranial nerve deficit.     Deep Tendon Reflexes: Reflexes are normal and symmetric.     Wt Readings from Last 3 Encounters:  06/11/20 205 lb (93 kg)  09/29/18 199 lb (90.3 kg)    BP (!) 148/90   Pulse 78   Ht 5\' 8"  (1.727 m)   Wt 205 lb (93 kg)   SpO2 98%   BMI 31.17 kg/m   Assessment and Plan: 1. Establishing care with new doctor, encounter for Patient  establishing care with new physician.  Patient will continue with VA coverage for his hyperlipidemia and hypertension as well as his ophthalmological concerns.

## 2021-04-20 ENCOUNTER — Telehealth: Payer: Self-pay | Admitting: Family Medicine

## 2021-04-20 NOTE — Telephone Encounter (Signed)
Copied from [place] [id number]. Topic: Medicare AWV ?>> Apr 20, 2021 10:29 AM Cher Nakai R wrote: ?Reason for CRM:  ?Left message for patient to call back and schedule Medicare Annual Wellness Visit (AWV) in office.  ? ?If unable to come into the office for AWV,  please offer to do virtually or by telephone. ? ?No hx of AWV eligible for AWVI per [employer] as of 10/17/2017  ? ?Please schedule at anytime with [facility] Advisor.     ? ?45 minute appointment  ? ?Any questions, please call me at [phone] ?

## 2021-06-07 ENCOUNTER — Ambulatory Visit (INDEPENDENT_AMBULATORY_CARE_PROVIDER_SITE_OTHER): Payer: Managed Care, Other (non HMO)

## 2021-06-07 DIAGNOSIS — E041 Nontoxic single thyroid nodule: Secondary | ICD-10-CM | POA: Insufficient documentation

## 2021-06-07 DIAGNOSIS — I10 Essential (primary) hypertension: Secondary | ICD-10-CM | POA: Insufficient documentation

## 2021-06-07 DIAGNOSIS — J309 Allergic rhinitis, unspecified: Secondary | ICD-10-CM | POA: Insufficient documentation

## 2021-06-07 DIAGNOSIS — I129 Hypertensive chronic kidney disease with stage 1 through stage 4 chronic kidney disease, or unspecified chronic kidney disease: Secondary | ICD-10-CM | POA: Insufficient documentation

## 2021-06-07 DIAGNOSIS — J45909 Unspecified asthma, uncomplicated: Secondary | ICD-10-CM | POA: Insufficient documentation

## 2021-06-07 DIAGNOSIS — IMO0002 Reserved for concepts with insufficient information to code with codable children: Secondary | ICD-10-CM | POA: Insufficient documentation

## 2021-06-07 DIAGNOSIS — N183 Chronic kidney disease, stage 3 unspecified: Secondary | ICD-10-CM | POA: Insufficient documentation

## 2021-06-07 DIAGNOSIS — J3089 Other allergic rhinitis: Secondary | ICD-10-CM | POA: Insufficient documentation

## 2021-06-07 DIAGNOSIS — Z461 Encounter for fitting and adjustment of hearing aid: Secondary | ICD-10-CM | POA: Insufficient documentation

## 2021-06-07 DIAGNOSIS — Z0000 Encounter for general adult medical examination without abnormal findings: Secondary | ICD-10-CM

## 2021-06-07 DIAGNOSIS — E213 Hyperparathyroidism, unspecified: Secondary | ICD-10-CM | POA: Insufficient documentation

## 2021-06-07 DIAGNOSIS — H401390 Pigmentary glaucoma, unspecified eye, stage unspecified: Secondary | ICD-10-CM | POA: Insufficient documentation

## 2021-06-07 DIAGNOSIS — Z905 Acquired absence of kidney: Secondary | ICD-10-CM | POA: Insufficient documentation

## 2021-06-07 DIAGNOSIS — R69 Illness, unspecified: Secondary | ICD-10-CM | POA: Insufficient documentation

## 2021-06-07 DIAGNOSIS — B0230 Zoster ocular disease, unspecified: Secondary | ICD-10-CM | POA: Insufficient documentation

## 2021-06-07 DIAGNOSIS — I861 Scrotal varices: Secondary | ICD-10-CM | POA: Insufficient documentation

## 2021-06-07 DIAGNOSIS — C642 Malignant neoplasm of left kidney, except renal pelvis: Secondary | ICD-10-CM | POA: Insufficient documentation

## 2021-06-07 NOTE — Progress Notes (Signed)
Subjective:   Luke Holland is a 71 y.o. male who presents for an Initial Medicare Annual Wellness Visit.  Virtual Visit via Telephone Note  I connected with  Luke Holland on 06/07/21 at  2:30 PM EDT by telephone and verified that I am speaking with the correct person using two identifiers.  Location: Patient: home Provider: [facility] Persons participating in the virtual visit: [place]   I discussed the limitations, risks, security and privacy concerns of performing an evaluation and management service by telephone and the availability of in person appointments. The patient expressed understanding and agreed to proceed.  Interactive audio and video telecommunications were attempted between this nurse and patient, however failed, due to patient having technical difficulties OR patient did not have access to video capability.  We continued and completed visit with audio only.  Some vital signs may be absent or patient reported.   Clemetine Marker, LPN   Review of Systems     Cardiac Risk Factors include: advanced age (>1mn, >>78women);hypertension;male gender     Objective:    Today's Vitals   06/07/21 1440  PainSc: 7    There is no height or weight on file to calculate BMI.     06/07/2021    2:53 PM 09/29/2018    4:36 AM  Advanced Directives  Does Patient Have a Medical Advance Directive? No No  Would patient like information on creating a medical advance directive? Yes (MAU/Ambulatory/Procedural Areas - Information given)     Current Medications (verified) Outpatient Encounter Medications as of 06/07/2021  Medication Sig   acetaminophen (TYLENOL) 325 MG tablet Take 650 mg by mouth every 6 (six) hours as needed.   amLODipine (NORVASC) 10 MG tablet TAKE ONE-HALF TABLET BY MOUTH EVERY DAY FOR BLOOD PRESSURE   aspirin 81 MG EC tablet Take 1 tablet by mouth daily.   brimonidine (ALPHAGAN) 0.2 % ophthalmic solution 1 drop 3 (three) times daily.   Calcium  Carb-Cholecalciferol 600-400 MG-UNIT TABS Take by mouth.   carboxymethylcellulose (REFRESH PLUS) 0.5 % SOLN Apply to eye.   cetirizine (ZYRTEC) 10 MG tablet TAKE ONE TABLET BY MOUTH ONCE EVERY DAY AS NEEDED   chlorthalidone (HYGROTON) 25 MG tablet Take 1 tablet by mouth daily.   dorzolamide-timolol (COSOPT) 22.3-6.8 MG/ML ophthalmic solution INSTILL 1 DROP DORZOLAMIDE 2/TIMOLOL 0.5% OPH SOLN IN LEFT EYE TWO TIMES A DAY   fluticasone (FLONASE) 50 MCG/ACT nasal spray Place into the nose.   lisinopril (ZESTRIL) 40 MG tablet TAKE ONE-HALF TABLET BY MOUTH EVERY DAY FOR BLOOD PRESSURE   montelukast (SINGULAIR) 10 MG tablet TAKE ONE TABLET BY MOUTH EVERY DAY FOR ASTHMA   [DISCONTINUED] amLODipine (NORVASC) 10 MG tablet Take by mouth.   [DISCONTINUED] cetirizine (ZYRTEC) 10 MG tablet Take by mouth.   [DISCONTINUED] latanoprost (XALATAN) 0.005 % ophthalmic solution Apply to eye.   [DISCONTINUED] lisinopril (ZESTRIL) 40 MG tablet Take by mouth.   [DISCONTINUED] montelukast (SINGULAIR) 10 MG tablet Take by mouth.   [DISCONTINUED] timolol (TIMOPTIC) 0.5 % ophthalmic solution Apply to eye.   No facility-administered encounter medications on file as of 06/07/2021.    Allergies (verified) Patient has no known allergies.   History: Past Medical History:  Diagnosis Date   Cancer (HEcho    renal cancer   Glaucoma    Hypertension    Renal disorder    patient states that he has one kidney   Thyroid disease    Past Surgical History:  Procedure Laterality Date   NEPHRECTOMY  PARATHYROIDECTOMY     Family History  Problem Relation Age of Onset   COPD Father    Social History   Socioeconomic History   Marital status: Married    Spouse name: Not on file   Number of children: Not on file   Years of education: Not on file   Highest education level: Not on file  Occupational History   Not on file  Tobacco Use   Smoking status: Never   Smokeless tobacco: Never  Vaping Use   Vaping Use:  Never used  Substance and Sexual Activity   Alcohol use: Never   Drug use: Never   Sexual activity: Not Currently  Other Topics Concern   Not on file  Social History Narrative   Not on file   Social Determinants of Health   Financial Resource Strain: Low Risk    Difficulty of Paying Living Expenses: Not hard at all  Food Insecurity: No Food Insecurity   Worried About Charity fundraiser in the Last Year: Never true   [place] in the Last Year: Never true  Transportation Needs: No Transportation Needs   Lack of Transportation (Medical): No   Lack of Transportation (Non-Medical): No  Physical Activity: Inactive   Days of Exercise per Week: 0 days   Minutes of Exercise per Session: 0 min  Stress: No Stress Concern Present   Feeling of Stress : Not at all  Social Connections: Moderately Isolated   Frequency of Communication with Friends and Family: More than three times a week   Frequency of Social Gatherings with Friends and Family: Once a week   Attends Religious Services: Never   Marine scientist or Organizations: No   Attends Music therapist: Never   Marital Status: Married    Tobacco Counseling Counseling given: Not Answered   Clinical Intake:  Pre-visit preparation completed: Yes  Pain : 0-10 Pain Score: 7  Pain Type: Acute pain Pain Location: Shoulder Pain Orientation: Right Pain Descriptors / Indicators: Aching, Dull, Sore Pain Onset: In the past 7 days Pain Frequency: Constant     Diabetes: No  How often do you need to have someone help you when you read instructions, pamphlets, or other written materials from your doctor or pharmacy?: 1 - Never    Interpreter Needed?: No  Information entered by :: Clemetine Marker LPN   Activities of Daily Living    06/07/2021    2:57 PM 06/11/2020    3:25 PM  In your present state of health, do you have any difficulty performing the following activities:  Hearing? 1 0  Vision? 0 0   Difficulty concentrating or making decisions? 0 0  Walking or climbing stairs? 0 0  Dressing or bathing? 0 0  Doing errands, shopping? 0 0  Preparing Food and eating ? N   Using the Toilet? N   In the past six months, have you accidently leaked urine? N   Do you have problems with loss of bowel control? N   Managing your Medications? N   Managing your Finances? N   Housekeeping or managing your Housekeeping? N     Patient Care Team: Juline Patch, MD as PCP - General (Family Medicine)  Indicate any recent Medical Services you may have received from other than [facility] providers in the past year (date may be approximate).     Assessment:   This is a routine wellness examination for Luke Holland.  Hearing/Vision screen Hearing Screening -  Comments:: Pt wears hearing aids  Vision Screening - Comments:: Annual vision screenings done at the [place]  Dietary issues and exercise activities discussed: Current Exercise Habits: The patient does not participate in regular exercise at present, Exercise limited by: None identified   Goals Addressed   None    Depression Screen    06/07/2021    2:52 PM 06/11/2020    3:25 PM  PHQ 2/9 Scores  PHQ - 2 Score 0 0  PHQ- 9 Score  2    Fall Risk    06/07/2021    2:56 PM 06/11/2020    3:25 PM  [place] in the past year? 0 0  Number falls in past yr: 0   Injury with Fall? 0   Risk for fall due to : No Fall Risks   Follow up Falls prevention discussed Falls evaluation completed    [place]:  Any stairs in or around the home? Yes  If so, are there any without handrails? No  Home free of loose throw rugs in walkways, pet beds, electrical cords, etc? Yes  Adequate lighting in your home to reduce risk of falls? Yes   ASSISTIVE DEVICES UTILIZED TO PREVENT FALLS:  Life alert? No  Use of a cane, walker or w/c? No  Grab bars in the bathroom? No  Shower chair or bench in shower? No  Elevated toilet seat or  a handicapped toilet? No   TIMED UP AND GO:  Was the test performed? No . Telephonic visit.   Cognitive Function: Normal cognitive status assessed by direct observation by this Nurse Health Advisor. No abnormalities found.          Immunizations Immunization History  Administered Date(s) Administered   Fluad Quad(high Dose 65+) 11/06/2019   Influenza, High Dose Seasonal PF 01/14/2021   Influenza, Seasonal, Injecte, Preservative Fre 11/25/2011, 12/10/2012, 12/17/2013, 12/24/2014   Influenza,inj,Quad PF,6+ Mos 11/30/2015   Influenza-Unspecified 09/17/2016, 12/06/2017   PFIZER Comirnaty(Gray Top)Covid-19 Tri-Sucrose Vaccine 05/07/2020   PFIZER(Purple Top)SARS-COV-2 Vaccination 02/24/2019, 03/17/2019, 10/29/2019   Pfizer Covid-19 Vaccine Bivalent Booster 110yr & up 01/06/2021   Pneumococcal Conjugate-13 07/01/2016   Pneumococcal Polysaccharide-23 09/12/2012, 02/09/2018   Tdap 11/18/2011   Zoster Recombinat (Shingrix) 04/06/2021   Zoster, Live 06/08/2015    TDAP status: Up to date  Flu Vaccine status: Up to date  Pneumococcal vaccine status: Up to date  Covid-19 vaccine status: Completed vaccines  Qualifies for Shingles Vaccine? Yes   Zostavax completed No   Shingrix Completed?: Yes  Screening Tests Health Maintenance  Topic Date Due   Hepatitis C Screening  Never done   COLONOSCOPY (Pts 45-464yrInsurance coverage will need to be confirmed)  Never done   Zoster Vaccines- Shingrix (2 of 2) 06/01/2021   INFLUENZA VACCINE  08/17/2021   TETANUS/TDAP  11/17/2021   Pneumonia Vaccine 6567Years old  Completed   COVID-19 Vaccine  Completed   HPV VACCINES  Aged Out    Health Maintenance  Health Maintenance Due  Topic Date Due   Hepatitis C Screening  Never done   COLONOSCOPY (Pts [id number] coverage will need to be confirmed)  Never done   Zoster Vaccines- Shingrix (2 of 2) 06/01/2021    Colorectal cancer screening: [facility]  Cancer Screening: (Low Dose CT Chest recommended if Age 44-3-80ars, 30 pack-year currently smoking OR have quit w/in 15years.) does not qualify.   Additional Screening:  Hepatitis C Screening: does qualify; due  Vision Screening: Recommended annual ophthalmology exams for early detection of glaucoma and other disorders of the eye. Is the patient up to date with their annual eye exam?  Yes  Who is the provider or what is the name of the office in which the patient attends annual eye exams? [facility].   Dental Screening: Recommended annual dental exams for proper oral hygiene  Community Resource Referral / Chronic Care Management: CRR required this visit?  No   CCM required this visit?  No      Plan:     I have personally reviewed and noted the following in the patient's chart:   Medical and social history Use of alcohol, tobacco or illicit drugs  Current medications and supplements including opioid prescriptions. Patient is not currently taking opioid prescriptions. Functional ability and status Nutritional status Physical activity Advanced directives List of other physicians Hospitalizations, surgeries, and ER visits in previous 12 months Vitals Screenings to include cognitive, depression, and falls Referrals and appointments  In addition, I have reviewed and discussed with patient certain preventive protocols, quality metrics, and best practice recommendations. A written personalized care plan for preventive services as well as general preventive health recommendations were provided to patient.     Clemetine Marker, LPN   03/10/9796   Nurse Notes: none

## 2021-06-07 NOTE — Patient Instructions (Signed)
Luke Holland , Thank you for taking time to come for your Medicare Wellness Visit. I appreciate your ongoing commitment to your health goals. Please review the following plan we discussed and let me know if I can assist you in the future.   Screening recommendations/referrals: Colonoscopy: due. Recommended yearly ophthalmology/optometry visit for glaucoma screening and checkup Recommended yearly dental visit for hygiene and checkup  Vaccinations: Influenza vaccine: done 01/14/21 Pneumococcal vaccine: done 02/09/18 Tdap vaccine: done 11/18/11 Shingles vaccine: done 04/06/21   Covid-19: done 02/24/19, 03/17/19, 10/29/19 & 05/07/20  Advanced directives: Advance directive discussed with you today. I have provided a copy for you to complete at home and have notarized. Once this is complete please bring a copy in to our office so we can scan it into your chart.   Conditions/risks identified: Keep up the great work!  Next appointment: Follow up in one year for your annual wellness visit.   Preventive Care 65 Years and Older, Male Preventive care refers to lifestyle choices and visits with your health care provider that can promote health and wellness. What does preventive care include? A yearly physical exam. This is also called an annual well check. Dental exams once or twice a year. Routine eye exams. Ask your health care provider how often you should have your eyes checked. Personal lifestyle choices, including: Daily care of your teeth and gums. Regular physical activity. Eating a healthy diet. Avoiding tobacco and drug use. Limiting alcohol use. Practicing safe sex. Taking low doses of aspirin every day. Taking vitamin and mineral supplements as recommended by your health care provider. What happens during an annual well check? The services and screenings done by your health care provider during your annual well check will depend on your age, overall health, lifestyle risk factors, and  family history of disease. Counseling  Your health care provider may ask you questions about your: Alcohol use. Tobacco use. Drug use. Emotional well-being. Home and relationship well-being. Sexual activity. Eating habits. History of falls. Memory and ability to understand (cognition). Work and work Statistician. Screening  You may have the following tests or measurements: Height, weight, and BMI. Blood pressure. Lipid and cholesterol levels. These may be checked every 5 years, or more frequently if you are over 50 years old. Skin check. Lung cancer screening. You may have this screening every year starting at age 68 if you have a 30-pack-year history of smoking and currently smoke or have quit within the past 15 years. Fecal occult blood test (FOBT) of the stool. You may have this test every year starting at age 49. Flexible sigmoidoscopy or colonoscopy. You may have a sigmoidoscopy every 5 years or a colonoscopy every 10 years starting at age 36. Prostate cancer screening. Recommendations will vary depending on your family history and other risks. Hepatitis C blood test. Hepatitis B blood test. Sexually transmitted disease (STD) testing. Diabetes screening. This is done by checking your blood sugar (glucose) after you have not eaten for a while (fasting). You may have this done every 1-3 years. Abdominal aortic aneurysm (AAA) screening. You may need this if you are a current or former smoker. Osteoporosis. You may be screened starting at age 29 if you are at high risk. Talk with your health care provider about your test results, treatment options, and if necessary, the need for more tests. Vaccines  Your health care provider may recommend certain vaccines, such as: Influenza vaccine. This is recommended every year. Tetanus, diphtheria, and acellular pertussis (Tdap, Td) vaccine.  You may need a Td booster every 10 years. Zoster vaccine. You may need this after age 57. Pneumococcal  13-valent conjugate (PCV13) vaccine. One dose is recommended after age 17. Pneumococcal polysaccharide (PPSV23) vaccine. One dose is recommended after age 57. Talk to your health care provider about which screenings and vaccines you need and how often you need them. This information is not intended to replace advice given to you by your health care provider. Make sure you discuss any questions you have with your health care provider. Document Released: 01/30/2015 Document Revised: 09/23/2015 Document Reviewed: 11/04/2014 Elsevier Interactive Patient Education  2017 [place] Prevention in the Home Falls can cause injuries. They can happen to people of all ages. There are many things you can do to make your home safe and to help prevent falls. What can I do on the outside of my home? Regularly fix the edges of walkways and driveways and fix any cracks. Remove anything that might make you trip as you walk through a door, such as a raised step or threshold. Trim any bushes or trees on the path to your home. Use bright outdoor lighting. Clear any walking paths of anything that might make someone trip, such as rocks or tools. Regularly check to see if handrails are loose or broken. Make sure that both sides of any steps have handrails. Any raised decks and porches should have guardrails on the edges. Have any leaves, snow, or ice cleared regularly. Use sand or salt on walking paths during winter. Clean up any spills in your garage right away. This includes oil or grease spills. What can I do in the bathroom? Use night lights. Install grab bars by the toilet and in the tub and shower. Do not use towel bars as grab bars. Use non-skid mats or decals in the tub or shower. If you need to sit down in the shower, use a plastic, non-slip stool. Keep the floor dry. Clean up any water that spills on the floor as soon as it happens. Remove soap buildup in the tub or shower regularly. Attach  bath mats securely with double-sided non-slip rug tape. Do not have throw rugs and other things on the floor that can make you trip. What can I do in the bedroom? Use night lights. Make sure that you have a light by your bed that is easy to reach. Do not use any sheets or blankets that are too big for your bed. They should not hang down onto the floor. Have a firm chair that has side arms. You can use this for support while you get dressed. Do not have throw rugs and other things on the floor that can make you trip. What can I do in the kitchen? Clean up any spills right away. Avoid walking on wet floors. Keep items that you use a lot in easy-to-reach places. If you need to reach something above you, use a strong step stool that has a grab bar. Keep electrical cords out of the way. Do not use floor polish or wax that makes floors slippery. If you must use wax, use non-skid floor wax. Do not have throw rugs and other things on the floor that can make you trip. What can I do with my stairs? Do not leave any items on the stairs. Make sure that there are handrails on both sides of the stairs and use them. Fix handrails that are broken or loose. Make sure that handrails are as long as  the stairways. Check any carpeting to make sure that it is firmly attached to the stairs. Fix any carpet that is loose or worn. Avoid having throw rugs at the top or bottom of the stairs. If you do have throw rugs, attach them to the floor with carpet tape. Make sure that you have a light switch at the top of the stairs and the bottom of the stairs. If you do not have them, ask someone to add them for you. What else can I do to help prevent falls? Wear shoes that: Do not have high heels. Have rubber bottoms. Are comfortable and fit you well. Are closed at the toe. Do not wear sandals. If you use a stepladder: Make sure that it is fully opened. Do not climb a closed stepladder. Make sure that both sides of the  stepladder are locked into place. Ask someone to hold it for you, if possible. Clearly mark and make sure that you can see: Any grab bars or handrails. First and last steps. Where the edge of each step is. Use tools that help you move around (mobility aids) if they are needed. These include: Canes. Walkers. Scooters. Crutches. Turn on the lights when you go into a dark area. Replace any light bulbs as soon as they burn out. Set up your furniture so you have a clear path. Avoid moving your furniture around. If any of your floors are uneven, fix them. If there are any pets around you, be aware of where they are. Review your medicines with your doctor. Some medicines can make you feel dizzy. This can increase your chance of falling. Ask your doctor what other things that you can do to help prevent falls. This information is not intended to replace advice given to you by your health care provider. Make sure you discuss any questions you have with your health care provider. Document Released: 10/30/2008 Document Revised: 06/11/2015 Document Reviewed: 02/07/2014 Elsevier Interactive Patient Education  2017 [employer].

## 2021-10-12 ENCOUNTER — Ambulatory Visit: Payer: Managed Care, Other (non HMO) | Admitting: Family Medicine

## 2021-10-12 ENCOUNTER — Ambulatory Visit
Admission: RE | Admit: 2021-10-12 | Discharge: 2021-10-12 | Disposition: A | Discharge status: Home / Self Care | Payer: Managed Care, Other (non HMO) | Attending: Family Medicine | Admitting: Family Medicine

## 2021-10-12 ENCOUNTER — Encounter: Payer: Self-pay | Admitting: Family Medicine

## 2021-10-12 ENCOUNTER — Ambulatory Visit
Admission: RE | Admit: 2021-10-12 | Discharge: 2021-10-12 | Disposition: A | Discharge status: Home / Self Care | Payer: Managed Care, Other (non HMO) | Source: Ambulatory Visit | Attending: Family Medicine | Admitting: *Deleted

## 2021-10-12 VITALS — BP 138/80 | HR 80 | Temp 98.6°F | Ht 68.0 in | Wt 213.0 lb

## 2021-10-12 DIAGNOSIS — R509 Fever, unspecified: Secondary | ICD-10-CM

## 2021-10-12 DIAGNOSIS — R051 Acute cough: Secondary | ICD-10-CM

## 2021-10-12 DIAGNOSIS — U071 COVID-19: Secondary | ICD-10-CM

## 2021-10-12 DIAGNOSIS — R059 Cough, unspecified: Secondary | ICD-10-CM | POA: Diagnosis not present

## 2021-10-12 LAB — POC COVID19 BINAXNOW: SARS Coronavirus 2 Ag: POSITIVE — AB

## 2021-10-12 MED ORDER — MOLNUPIRAVIR EUA 200MG CAPSULE: 4.0000 | ORAL_CAPSULE | Freq: Two times a day (BID) | ORAL | 0 refills | Status: AC

## 2021-10-12 NOTE — Progress Notes (Signed)
Date:  10/12/2021   Name:  Luke Holland   DOB:  1950/06/09   MRN:  [id number]   Chief Complaint: Cough (Started with cong, fever and cough on the 22nd of Sept, after going on cruise. Coughing up creamy/ clear production. )  Cough This is a new problem. The current episode started in the past 7 days. The problem has been unchanged. The problem occurs constantly. The cough is Productive of purulent sputum (yellow). Associated symptoms include a fever, headaches, myalgias, nasal congestion, postnasal drip, a sore throat and weight loss. Pertinent negatives include no chest pain, ear pain or shortness of breath.    Lab Results  Component Value Date   NA 138 09/29/2018   K 3.6 09/29/2018   CO2 24 09/29/2018   GLUCOSE 118 (H) 09/29/2018   BUN 25 (H) 09/29/2018   CREATININE 1.35 (H) 09/29/2018   CALCIUM 6.7 (L) 09/29/2018   GFRNONAA 54 (L) 09/29/2018   No results found for: "CHOL", "HDL", "LDLCALC", "LDLDIRECT", "TRIG", "CHOLHDL" No results found for: "TSH" No results found for: "HGBA1C" Lab Results  Component Value Date   WBC 11.3 (H) 09/29/2018   HGB 14.7 09/29/2018   HCT 43.7 09/29/2018   MCV 90.1 09/29/2018   PLT 184 09/29/2018   Lab Results  Component Value Date   ALT 20 09/29/2018   AST 16 09/29/2018   ALKPHOS 97 09/29/2018   BILITOT 0.8 09/29/2018   No results found for: "25OHVITD2", "25OHVITD3", "VD25OH"   Review of Systems  Constitutional:  Positive for fever and weight loss.  HENT:  Positive for postnasal drip and sore throat. Negative for ear pain and sinus pressure.   Respiratory:  Positive for cough. Negative for shortness of breath.   Cardiovascular:  Negative for chest pain and palpitations.  Gastrointestinal:  Negative for abdominal pain.  Endocrine: Negative for polydipsia and polyuria.  Musculoskeletal:  Positive for arthralgias and myalgias. Negative for neck stiffness.  Neurological:  Positive for headaches.    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   Other ill-defined and unknown causes of morbidity and mortality 06/07/2021   Malignant neoplasm of left kidney, except renal pelvis ([place]) 06/07/2021   Hypocalcemia 06/07/2021   Hypertensive chronic kidney disease with stage 1 through stage 4 chronic kidney disease, or unspecified chronic kidney disease 06/07/2021   Hyperparathyroidism, unspecified ([place]) 06/07/2021   History of radical nephrectomy 06/07/2021   Herpes zoster ophthalmicus 06/07/2021   Essential (primary) hypertension 06/07/2021   Encounter for fitting and adjustment of hearing aid 06/07/2021   Asthma 06/07/2021   Allergic rhinitis due to other allergen 06/07/2021   Chronic kidney disease, stage 3 ([place]) 06/07/2021    No Known Allergies  Past Surgical History:  Procedure Laterality Date   NEPHRECTOMY     PARATHYROIDECTOMY      Social History   Tobacco Use   Smoking status: Never   Smokeless tobacco: Never  Vaping Use   Vaping Use: Never used  Substance Use Topics   Alcohol use: Never   Drug use: Never     Medication list has been reviewed and updated.  Current Meds  Medication Sig   acetaminophen (TYLENOL) 325 MG tablet Take 650 mg by mouth every 6 (six) hours as needed.   amLODipine (NORVASC) 10 MG tablet TAKE ONE-HALF TABLET BY MOUTH EVERY DAY  FOR BLOOD PRESSURE   aspirin 81 MG EC tablet Take 1 tablet by mouth daily.   brimonidine (ALPHAGAN) 0.2 % ophthalmic solution 1 drop 3 (three) times daily.   Calcium Carb-Cholecalciferol 600-400 MG-UNIT TABS Take by mouth.   carboxymethylcellulose (REFRESH PLUS) 0.5 % SOLN Apply to eye.   cetirizine (ZYRTEC) 10 MG tablet TAKE ONE TABLET BY MOUTH ONCE EVERY DAY AS NEEDED   chlorthalidone (HYGROTON) 25 MG tablet Take 1 tablet by mouth daily.   dorzolamide-timolol (COSOPT) 22.3-6.8 MG/ML  ophthalmic solution INSTILL 1 DROP DORZOLAMIDE 2/TIMOLOL 0.5% OPH SOLN IN LEFT EYE TWO TIMES A DAY   fluticasone (FLONASE) 50 MCG/ACT nasal spray Place into the nose.   lisinopril (ZESTRIL) 40 MG tablet TAKE ONE-HALF TABLET BY MOUTH EVERY DAY FOR BLOOD PRESSURE   montelukast (SINGULAIR) 10 MG tablet TAKE ONE TABLET BY MOUTH EVERY DAY FOR ASTHMA       10/12/2021    3:26 PM 06/11/2020    3:25 PM  GAD 7 : Generalized Anxiety Score  Nervous, Anxious, on Edge 0 0  Control/stop worrying 0 0  Worry too much - different things 0 0  Trouble relaxing 0 0  Restless 0 0  Easily annoyed or irritable 0 0  Afraid - awful might happen 0 0  Total GAD 7 Score 0 0  Anxiety Difficulty Not difficult at all        10/12/2021    3:26 PM 06/07/2021    2:52 PM 06/11/2020    3:25 PM  Depression screen PHQ 2/9  Decreased Interest 0 0 0  Down, Depressed, Hopeless 0 0 0  PHQ - 2 Score 0 0 0  Altered sleeping 0  0  Tired, decreased energy 0  2  Change in appetite 0  0  Feeling bad or failure about yourself  0  0  Trouble concentrating 0  0  Moving slowly or fidgety/restless 0  0  Suicidal thoughts 0  0  PHQ-9 Score 0  2  Difficult doing work/chores Not difficult at all      BP Readings from Last 3 Encounters:  10/12/21 138/80  06/11/20 (!) 148/90  09/29/18 (!) 154/84    Physical Exam Vitals and nursing note reviewed.  HENT:     Head: Normocephalic.     Right Ear: Tympanic membrane, ear canal and external ear normal.     Left Ear: Tympanic membrane, ear canal and external ear normal.     Nose: Nose normal. No congestion or rhinorrhea.     Mouth/Throat:     Mouth: Mucous membranes are moist.  Eyes:     General: No scleral icterus.       Right eye: No discharge.        Left eye: No discharge.     Conjunctiva/sclera: Conjunctivae normal.     Pupils: Pupils are equal, round, and reactive to light.  Neck:     Thyroid: No thyromegaly.     Vascular: No JVD.     Trachea: No tracheal deviation.   Cardiovascular:     Rate and Rhythm: Normal rate and regular rhythm.     Heart sounds: Normal heart sounds. No murmur heard.    No friction rub. No gallop.  Pulmonary:     Effort: No respiratory distress.     Breath sounds: Normal breath sounds. No wheezing, rhonchi or rales.  Abdominal:     General: Bowel sounds are normal.     Palpations: Abdomen is soft. There is no mass.  Tenderness: There is no abdominal tenderness. There is no guarding or rebound.  Musculoskeletal:        General: No tenderness. Normal range of motion.     Cervical back: Normal range of motion and neck supple.  Lymphadenopathy:     Cervical: No cervical adenopathy.  Skin:    General: Skin is warm.     Findings: No rash.     Wt Readings from Last 3 Encounters:  10/12/21 213 lb (96.6 kg)  06/11/20 205 lb (93 kg)  09/29/18 199 lb (90.3 kg)    BP 138/80   Pulse 80   Temp 98.6 F (37 C) (Oral)   Ht '5\' 8"'$  (1.727 m)   Wt 213 lb (96.6 kg)   BMI 32.39 kg/m   Assessment and Plan: 1. Acute cough New onset.  Persistent.  Pulmonary exam is unremarkable for rales rhonchi wheezes but patient is positive for COVID and will obtain a chest x-ray given that the pulse ox started at 95% and eventually came up to 97. - POC COVID-19 - DG Chest 2 View; Future  2. Fever and chills As noted above - POC COVID-19 - DG Chest 2 View; Future  3. COVID New onset.  Persistent.  Patient has had a 4-day history of symptoms including cough myalgias fever and chills.  Patient has just returned from a cruise and and afterwards developed symptoms and today is positive for COVID.  We will treat patient with molnupiravir 200 mg 4 capsules twice a day for 5 days and have suggested patient take Mucinex DM for cough. - molnupiravir EUA (LAGEVRIO) 200 mg CAPS capsule; Take 4 capsules (800 mg total) by mouth 2 (two) times daily for 5 days.  Dispense: 40 capsule; Refill: 0     Otilio Miu, MD

## 2022-05-18 ENCOUNTER — Telehealth: Payer: Self-pay | Admitting: Family Medicine

## 2022-05-18 NOTE — Telephone Encounter (Signed)
Contacted Luke Holland to schedule their annual wellness visit. Appointment made for 06/09/2022.  Verlee Rossetti; Care Guide Ambulatory Clinical Support [facility] Direct Dial: [phone]

## 2022-06-09 ENCOUNTER — Ambulatory Visit: Payer: Managed Care, Other (non HMO)

## 2022-06-09 VITALS — BP 148/74 | Ht 68.0 in | Wt 221.4 lb

## 2022-06-09 DIAGNOSIS — Z0000 Encounter for general adult medical examination without abnormal findings: Secondary | ICD-10-CM

## 2022-06-09 NOTE — Progress Notes (Signed)
Subjective:   Luke Holland is a 72 y.o. male who presents for Medicare Annual/Subsequent preventive examination.  Review of Systems     Cardiac Risk Factors include: advanced age (>73men, >84 women);hypertension;male gender;obesity (BMI >30kg/m2)     Objective:    Today's Vitals   06/09/22 1010  BP: (!) 148/74  Weight: 221 lb 6.4 oz (100.4 kg)  Height: 5\' 8"  (1.727 m)   Body mass index is 33.66 kg/m.     06/09/2022   10:19 AM 06/07/2021    2:53 PM 09/29/2018    4:36 AM  Advanced Directives  Does Patient Have a Medical Advance Directive? No No No  Would patient like information on creating a medical advance directive? No - Patient declined Yes (MAU/Ambulatory/Procedural Areas - Information given)     Current Medications (verified) Outpatient Encounter Medications as of 06/09/2022  Medication Sig   acetaminophen (TYLENOL) 325 MG tablet Take 650 mg by mouth every 6 (six) hours as needed.   amLODipine (NORVASC) 10 MG tablet TAKE ONE-HALF TABLET BY MOUTH EVERY DAY FOR BLOOD PRESSURE   aspirin 81 MG EC tablet Take 1 tablet by mouth daily.   brimonidine (ALPHAGAN) 0.2 % ophthalmic solution 1 drop 3 (three) times daily.   Calcium Carb-Cholecalciferol 600-400 MG-UNIT TABS Take by mouth.   carboxymethylcellulose (REFRESH PLUS) 0.5 % SOLN Apply to eye.   cetirizine (ZYRTEC) 10 MG tablet TAKE ONE TABLET BY MOUTH ONCE EVERY DAY AS NEEDED   chlorthalidone (HYGROTON) 25 MG tablet Take 1 tablet by mouth daily.   dorzolamide-timolol (COSOPT) 22.3-6.8 MG/ML ophthalmic solution INSTILL 1 DROP DORZOLAMIDE 2/TIMOLOL 0.5% OPH SOLN IN LEFT EYE TWO TIMES A DAY   fluticasone (FLONASE) 50 MCG/ACT nasal spray Place into the nose.   lisinopril (ZESTRIL) 40 MG tablet TAKE ONE-HALF TABLET BY MOUTH EVERY DAY FOR BLOOD PRESSURE   montelukast (SINGULAIR) 10 MG tablet TAKE ONE TABLET BY MOUTH EVERY DAY FOR ASTHMA   No facility-administered encounter medications on file as of 06/09/2022.    Allergies  (verified) Patient has no known allergies.   History: Past Medical History:  Diagnosis Date   Cancer (HCC)    renal cancer   Glaucoma    Hypertension    Renal disorder    patient states that he has one kidney   Thyroid disease    Past Surgical History:  Procedure Laterality Date   NEPHRECTOMY     PARATHYROIDECTOMY     Family History  Problem Relation Age of Onset   COPD Father    Social History   Socioeconomic History   Marital status: Married    Spouse name: Not on file   Number of children: Not on file   Years of education: Not on file   Highest education level: Not on file  Occupational History   Not on file  Tobacco Use   Smoking status: Never   Smokeless tobacco: Never  Vaping Use   Vaping Use: Never used  Substance and Sexual Activity   Alcohol use: Never   Drug use: Never   Sexual activity: Not Currently  Other Topics Concern   Not on file  Social History Narrative   Not on file   Social Determinants of Health   Financial Resource Strain: Low Risk  (06/09/2022)   Overall Financial Resource Strain (CARDIA)    Difficulty of Paying Living Expenses: Not very hard  Food Insecurity: No Food Insecurity (06/09/2022)   Hunger Vital Sign    Worried About Running Out of Food  in the Last Year: Never true    Ran Out of Food in the Last Year: Never true  Transportation Needs: No Transportation Needs (06/09/2022)   PRAPARE - Administrator, Civil Service (Medical): No    Lack of Transportation (Non-Medical): No  Physical Activity: Sufficiently Active (06/09/2022)   Exercise Vital Sign    Days of Exercise per Week: 7 days    Minutes of Exercise per Session: 30 min  Stress: No Stress Concern Present (06/09/2022)   [employer] of Occupational Health - Occupational Stress Questionnaire    Feeling of Stress : Not at all  Social Connections: Unknown (06/09/2022)   Social Connection and Isolation Panel [NHANES]    Frequency of Communication with  Friends and Family: Once a week    Frequency of Social Gatherings with Friends and Family: Not on file    Attends Religious Services: Never    Database administrator or Organizations: No    Attends Engineer, structural: Never    Marital Status: Married    Tobacco Counseling Counseling given: Not Answered   Clinical Intake:  Pre-visit preparation completed: Yes  Pain : No/denies pain     Nutritional Risks: None Diabetes: No  How often do you need to have someone help you when you read instructions, pamphlets, or other written materials from your doctor or pharmacy?: 1 - Never  Diabetic?no  Interpreter Needed?: No  Information entered by :: Kennedy Bucker, LPN   Activities of Daily Living    06/09/2022   10:20 AM  In your present state of health, do you have any difficulty performing the following activities:  Hearing? 0  Vision? 0  Difficulty concentrating or making decisions? 0  Walking or climbing stairs? 0  Dressing or bathing? 0  Doing errands, shopping? 0  Preparing Food and eating ? N  Using the Toilet? N  In the past six months, have you accidently leaked urine? N  Do you have problems with loss of bowel control? N  Managing your Medications? N  Managing your Finances? N  Housekeeping or managing your Housekeeping? N    Patient Care Team: Duanne Limerick, MD as PCP - General (Family Medicine)  Indicate any recent Medical Services you may have received from other than [facility] providers in the past year (date may be approximate).     Assessment:   This is a routine wellness examination for Luke Holland.  Hearing/Vision screen Hearing Screening - Comments:: Wears aids Vision Screening - Comments:: Wears glasses- V.A.   Dietary issues and exercise activities discussed: Current Exercise Habits: Home exercise routine, Type of exercise: walking, Time (Minutes): 30, Frequency (Times/Week): 7, Weekly Exercise (Minutes/Week): 210, Intensity: Mild   Goals  Addressed             This Visit's Progress    DIET - EAT MORE FRUITS AND VEGETABLES         Depression Screen    06/09/2022   10:17 AM 10/12/2021    3:26 PM 06/07/2021    2:52 PM 06/11/2020    3:25 PM  PHQ 2/9 Scores  PHQ - 2 Score 0 0 0 0  PHQ- 9 Score 0 0  2    Fall Risk    06/09/2022   10:19 AM 10/12/2021    3:26 PM 06/07/2021    2:56 PM 06/11/2020    3:25 PM  Fall Risk   Falls in the past year? 0 0 0 0  Number falls in  past yr: 0 0 0   Injury with Fall? 0 0 0   Risk for fall due to : No Fall Risks No Fall Risks No Fall Risks   Follow up Falls prevention discussed;Falls evaluation completed Falls evaluation completed Falls prevention discussed Falls evaluation completed    FALL RISK PREVENTION PERTAINING TO THE HOME:  Any stairs in or around the home? Yes  If so, are there any without handrails? No  Home free of loose throw rugs in walkways, pet beds, electrical cords, etc? Yes  Adequate lighting in your home to reduce risk of falls? Yes   ASSISTIVE DEVICES UTILIZED TO PREVENT FALLS:  Life alert? No  Use of a cane, walker or w/c? No  Grab bars in the bathroom? No  Shower chair or bench in shower? No  Elevated toilet seat or a handicapped toilet? No   TIMED UP AND GO:  Was the test performed? Yes .  Length of time to ambulate 10 feet: 4 sec.   Gait steady and fast without use of assistive device  Cognitive Function:        06/09/2022   10:23 AM  6CIT Screen  What Year? 0 points  What month? 0 points  What time? 0 points  Count back from 20 0 points  Months in reverse 0 points  Repeat phrase 0 points  Total Score 0 points    Immunizations Immunization History  Administered Date(s) Administered   COVID-19, mRNA, vaccine(Comirnaty)12 years and older 11/29/2021   Fluad Quad(high Dose 65+) 11/06/2019, 01/14/2021   Influenza, High Dose Seasonal PF 01/14/2021, 11/28/2021   Influenza, Seasonal, Injecte, Preservative Fre 11/25/2011, 12/10/2012,  12/17/2013, 12/24/2014   Influenza,inj,Quad PF,6+ Mos 11/30/2015   Influenza-Unspecified 09/17/2016, 12/06/2017   PFIZER Comirnaty(Gray Top)Covid-19 Tri-Sucrose Vaccine 05/07/2020   PFIZER(Purple Top)SARS-COV-2 Vaccination 02/24/2019, 03/17/2019, 10/29/2019   Pfizer Covid-19 Vaccine Bivalent Booster 68yrs & up 01/06/2021   Pneumococcal Conjugate-13 07/01/2016   Pneumococcal Polysaccharide-23 09/12/2012, 02/09/2018   Tdap 11/18/2011, 04/12/2022   Zoster Recombinat (Shingrix) 04/06/2021, 07/06/2021   Zoster, Live 06/08/2015    TDAP status: Up to date  Flu Vaccine status: Up to date  Pneumococcal vaccine status: Up to date  Covid-19 vaccine status: Completed vaccines  Qualifies for Shingles Vaccine? Yes   Zostavax completed Yes   Shingrix Completed?: Yes  Screening Tests Health Maintenance  Topic Date Due   COVID-19 Vaccine (7 - 2023-24 season) 01/24/2022   COLONOSCOPY (Pts 45-48yrs Insurance coverage will need to be confirmed)  10/13/2022 (Originally 10/04/1995)   Hepatitis C Screening  10/13/2022 (Originally 10/04/1968)   INFLUENZA VACCINE  08/18/2022   Medicare Annual Wellness (AWV)  06/09/2023   DTaP/Tdap/Td (3 - Td or Tdap) 04/11/2032   Pneumonia Vaccine 91+ Years old  Completed   Zoster Vaccines- Shingrix  Completed   HPV VACCINES  Aged Out    Health Maintenance  Health Maintenance Due  Topic Date Due   COVID-19 Vaccine (7 - 2023-24 season) 01/24/2022   Colonoscopy postponed until 10/13/22  Lung Cancer Screening: (Low Dose CT Chest recommended if Age 36-80 years, 30 pack-year currently smoking OR have quit w/in 15years.) does not qualify.    Additional Screening:  Hepatitis C Screening: does qualify; Completed no  Vision Screening: Recommended annual ophthalmology exams for early detection of glaucoma and other disorders of the eye. Is the patient up to date with their annual eye exam?  Yes  Who is the provider or what is the name of the office in which the  patient  attends annual eye exams? [facility]. If pt is not established with a provider, would they like to be referred to a provider to establish care? No .   Dental Screening: Recommended annual dental exams for proper oral hygiene  Community Resource Referral / Chronic Care Management: CRR required this visit?  No   CCM required this visit?  No      Plan:     I have personally reviewed and noted the following in the patient's chart:   Medical and social history Use of alcohol, tobacco or illicit drugs  Current medications and supplements including opioid prescriptions. Patient is not currently taking opioid prescriptions. Functional ability and status Nutritional status Physical activity Advanced directives List of other physicians Hospitalizations, surgeries, and ER visits in previous 12 months Vitals Screenings to include cognitive, depression, and falls Referrals and appointments  In addition, I have reviewed and discussed with patient certain preventive protocols, quality metrics, and best practice recommendations. A written personalized care plan for preventive services as well as general preventive health recommendations were provided to patient.     Hal Hope, LPN   [id number]/61/[id number]   Nurse Notes: none

## 2022-06-09 NOTE — Patient Instructions (Signed)
Luke Holland , Thank you for taking time to come for your Medicare Wellness Visit. I appreciate your ongoing commitment to your health goals. Please review the following plan we discussed and let me know if I can assist you in the future.   These are the goals we discussed:  Goals      DIET - EAT MORE FRUITS AND VEGETABLES        This is a list of the screening recommended for you and due dates:  Health Maintenance  Topic Date Due   COVID-19 Vaccine (7 - 2023-24 season) 01/24/2022   Colon Cancer Screening  10/13/2022*   Hepatitis C Screening: USPSTF Recommendation to screen - Ages 71-79 yo.  10/13/2022*   Flu Shot  08/18/2022   Medicare Annual Wellness Visit  06/09/2023   DTaP/Tdap/Td vaccine (3 - Td or Tdap) 04/11/2032   Pneumonia Vaccine  Completed   Zoster (Shingles) Vaccine  Completed   HPV Vaccine  Aged Out  *Topic was postponed. The date shown is not the original due date.    Advanced directives: no  Conditions/risks identified: none  Next appointment: Follow up in one year for your annual wellness visit. 06/15/23 @ 9:15 am in person  Preventive Care 65 Years and Older, Male  Preventive care refers to lifestyle choices and visits with your health care provider that can promote health and wellness. What does preventive care include? A yearly physical exam. This is also called an annual well check. Dental exams once or twice a year. Routine eye exams. Ask your health care provider how often you should have your eyes checked. Personal lifestyle choices, including: Daily care of your teeth and gums. Regular physical activity. Eating a healthy diet. Avoiding tobacco and drug use. Limiting alcohol use. Practicing safe sex. Taking low doses of aspirin every day. Taking vitamin and mineral supplements as recommended by your health care provider. What happens during an annual well check? The services and screenings done by your health care provider during your annual well  check will depend on your age, overall health, lifestyle risk factors, and family history of disease. Counseling  Your health care provider may ask you questions about your: Alcohol use. Tobacco use. Drug use. Emotional well-being. Home and relationship well-being. Sexual activity. Eating habits. History of falls. Memory and ability to understand (cognition). Work and work Astronomer. Screening  You may have the following tests or measurements: Height, weight, and BMI. Blood pressure. Lipid and cholesterol levels. These may be checked every 5 years, or more frequently if you are over 29 years old. Skin check. Lung cancer screening. You may have this screening every year starting at age 71 if you have a 30-pack-year history of smoking and currently smoke or have quit within the past 15 years. Fecal occult blood test (FOBT) of the stool. You may have this test every year starting at age 71. Flexible sigmoidoscopy or colonoscopy. You may have a sigmoidoscopy every 5 years or a colonoscopy every 10 years starting at age 71. Prostate cancer screening. Recommendations will vary depending on your family history and other risks. Hepatitis C blood test. Hepatitis B blood test. Sexually transmitted disease (STD) testing. Diabetes screening. This is done by checking your blood sugar (glucose) after you have not eaten for a while (fasting). You may have this done every 1-3 years. Abdominal aortic aneurysm (AAA) screening. You may need this if you are a current or former smoker. Osteoporosis. You may be screened starting at age 71 if you  are at high risk. Talk with your health care provider about your test results, treatment options, and if necessary, the need for more tests. Vaccines  Your health care provider may recommend certain vaccines, such as: Influenza vaccine. This is recommended every year. Tetanus, diphtheria, and acellular pertussis (Tdap, Td) vaccine. You may need a Td booster  every 10 years. Zoster vaccine. You may need this after age 71. Pneumococcal 13-valent conjugate (PCV13) vaccine. One dose is recommended after age 71. Pneumococcal polysaccharide (PPSV23) vaccine. One dose is recommended after age 71. Talk to your health care provider about which screenings and vaccines you need and how often you need them. This information is not intended to replace advice given to you by your health care provider. Make sure you discuss any questions you have with your health care provider. Document Released: 01/30/2015 Document Revised: 09/23/2015 Document Reviewed: 11/04/2014 Elsevier Interactive Patient Education  2017 [employer].  Fall Prevention in the Home Falls can cause injuries. They can happen to people of all ages. There are many things you can do to make your home safe and to help prevent falls. What can I do on the outside of my home? Regularly fix the edges of walkways and driveways and fix any cracks. Remove anything that might make you trip as you walk through a door, such as a raised step or threshold. Trim any bushes or trees on the path to your home. Use bright outdoor lighting. Clear any walking paths of anything that might make someone trip, such as rocks or tools. Regularly check to see if handrails are loose or broken. Make sure that both sides of any steps have handrails. Any raised decks and porches should have guardrails on the edges. Have any leaves, snow, or ice cleared regularly. Use sand or salt on walking paths during winter. Clean up any spills in your garage right away. This includes oil or grease spills. What can I do in the bathroom? Use night lights. Install grab bars by the toilet and in the tub and shower. Do not use towel bars as grab bars. Use non-skid mats or decals in the tub or shower. If you need to sit down in the shower, use a plastic, non-slip stool. Keep the floor dry. Clean up any water that spills on the floor as soon  as it happens. Remove soap buildup in the tub or shower regularly. Attach bath mats securely with double-sided non-slip rug tape. Do not have throw rugs and other things on the floor that can make you trip. What can I do in the bedroom? Use night lights. Make sure that you have a light by your bed that is easy to reach. Do not use any sheets or blankets that are too big for your bed. They should not hang down onto the floor. Have a firm chair that has side arms. You can use this for support while you get dressed. Do not have throw rugs and other things on the floor that can make you trip. What can I do in the kitchen? Clean up any spills right away. Avoid walking on wet floors. Keep items that you use a lot in easy-to-reach places. If you need to reach something above you, use a strong step stool that has a grab bar. Keep electrical cords out of the way. Do not use floor polish or wax that makes floors slippery. If you must use wax, use non-skid floor wax. Do not have throw rugs and other things on the  floor that can make you trip. What can I do with my stairs? Do not leave any items on the stairs. Make sure that there are handrails on both sides of the stairs and use them. Fix handrails that are broken or loose. Make sure that handrails are as long as the stairways. Check any carpeting to make sure that it is firmly attached to the stairs. Fix any carpet that is loose or worn. Avoid having throw rugs at the top or bottom of the stairs. If you do have throw rugs, attach them to the floor with carpet tape. Make sure that you have a light switch at the top of the stairs and the bottom of the stairs. If you do not have them, ask someone to add them for you. What else can I do to help prevent falls? Wear shoes that: Do not have high heels. Have rubber bottoms. Are comfortable and fit you well. Are closed at the toe. Do not wear sandals. If you use a stepladder: Make sure that it is fully  opened. Do not climb a closed stepladder. Make sure that both sides of the stepladder are locked into place. Ask someone to hold it for you, if possible. Clearly mark and make sure that you can see: Any grab bars or handrails. First and last steps. Where the edge of each step is. Use tools that help you move around (mobility aids) if they are needed. These include: Canes. Walkers. Scooters. Crutches. Turn on the lights when you go into a dark area. Replace any light bulbs as soon as they burn out. Set up your furniture so you have a clear path. Avoid moving your furniture around. If any of your floors are uneven, fix them. If there are any pets around you, be aware of where they are. Review your medicines with your doctor. Some medicines can make you feel dizzy. This can increase your chance of falling. Ask your doctor what other things that you can do to help prevent falls. This information is not intended to replace advice given to you by your health care provider. Make sure you discuss any questions you have with your health care provider. Document Released: 10/30/2008 Document Revised: 06/11/2015 Document Reviewed: 02/07/2014 Elsevier Interactive Patient Education  2017 [employer].

## 2023-04-12 LAB — BASIC METABOLIC PANEL WITH GFR
BUN: 29 — AB (ref 4–21)
CO2: 29 — AB (ref 13–22)
Chloride: 101 (ref 99–108)
Creatinine: 1.5 — AB (ref 0.6–1.3)
Glucose: 89
Potassium: 3.6 meq/L (ref 3.5–5.1)
Sodium: 139 (ref 137–147)

## 2023-04-12 LAB — LIPID PANEL
Cholesterol: 234 — AB (ref 0–200)
HDL: 36 (ref 35–70)
LDL Cholesterol: 115
Triglycerides: 396 — AB (ref 40–160)

## 2023-04-12 LAB — COMPREHENSIVE METABOLIC PANEL WITH GFR
Calcium: 10 (ref 8.7–10.7)
eGFR: 49

## 2023-04-12 LAB — CBC AND DIFFERENTIAL
HCT: 45 (ref 41–53)
Hemoglobin: 15.6 (ref 13.5–17.5)
Platelets: 225 10*3/uL (ref 150–400)
WBC: 8.5

## 2023-04-12 LAB — TSH: TSH: 1.96 (ref 0.41–5.90)

## 2023-04-12 LAB — PSA: PSA: 2.65

## 2023-04-12 LAB — HEMOGLOBIN A1C: Hemoglobin A1C: 5.9

## 2023-06-27 ENCOUNTER — Ambulatory Visit (INDEPENDENT_AMBULATORY_CARE_PROVIDER_SITE_OTHER): Admitting: Student

## 2023-06-27 ENCOUNTER — Encounter: Payer: Self-pay | Admitting: Student

## 2023-06-27 VITALS — BP 142/70 | HR 71 | Ht 68.0 in | Wt 222.0 lb

## 2023-06-27 DIAGNOSIS — E785 Hyperlipidemia, unspecified: Secondary | ICD-10-CM | POA: Insufficient documentation

## 2023-06-27 DIAGNOSIS — I10 Essential (primary) hypertension: Secondary | ICD-10-CM

## 2023-06-27 DIAGNOSIS — R7303 Prediabetes: Secondary | ICD-10-CM | POA: Insufficient documentation

## 2023-06-27 DIAGNOSIS — J3089 Other allergic rhinitis: Secondary | ICD-10-CM | POA: Diagnosis not present

## 2023-06-27 DIAGNOSIS — E782 Mixed hyperlipidemia: Secondary | ICD-10-CM

## 2023-06-27 MED ORDER — ATORVASTATIN CALCIUM 20 MG PO TABS: 20.0000 mg | ORAL_TABLET | Freq: Every day | ORAL | 3 refills | Status: DC

## 2023-06-27 MED ORDER — ATORVASTATIN CALCIUM 40 MG PO TABS: 40.0000 mg | ORAL_TABLET | Freq: Every day | ORAL | 3 refills | Status: AC

## 2023-06-27 NOTE — Assessment & Plan Note (Signed)
 Recent labs in April with elevated total cholesterol and LDL. 10 year ASCVD risk is 31%, he is agreeable to starting a statin. Will check CMP today and start atorvastatin 40 mg daily.

## 2023-06-27 NOTE — Assessment & Plan Note (Signed)
 Reports allergies to shellfish, ragweed, pollen, and cats.   Using montelukast, cetrizine and nasal corticosteroid which help with allergies.

## 2023-06-27 NOTE — Assessment & Plan Note (Addendum)
 Currently taking amlodipine 10 mg daily, chlorthalidone 15 mg daily, and lisinopril 20 mg daily. [facility]mlodip[facility] was [facility]. BP at that time was 140/70. BP today 142/70 today. He reports compliance with medication. He is asymptomatic today. I have asked him to discuss with PCP if he should increase his lisinopril for increased BP control. May be difficult to control BP given CKD3a and single R kidney.

## 2023-06-27 NOTE — Progress Notes (Signed)
 Established Patient Office Visit  Subjective   Patient ID: Luke Holland, male    DOB: 1950/07/07  Age: 73 y.o. MRN: [id number]  Chief Complaint  Patient presents with   Establish Care   Ear Luke Holland presents today for follow up. No acute complaints today. Saw his PCP at [facility] a few months ago. Has upcoming visit with nephrologist. Does note BP medication was changed at last visit but otherwise no new update.    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   Malignant neoplasm of left kidney, except renal pelvis (HCC) 06/07/2021   Hyperparathyroidism, unspecified (HCC) 06/07/2021   Herpes zoster ophthalmicus 06/07/2021   Essential (primary) hypertension 06/07/2021   Encounter for fitting and adjustment of hearing aid 06/07/2021   Allergic rhinitis 06/07/2021   Chronic kidney disease, stage 3 (HCC) 06/07/2021      ROS Refer to HPI    Objective:     BP (!) 142/70   Pulse 71   Ht 5\' 8"  (1.727 m)   Wt 222 lb (100.7 kg)   SpO2 96%   BMI 33.75 kg/m  BP Readings from Last 3 Encounters:  06/27/23 (!) 142/70  06/09/22 (!) 148/74  10/12/21 138/80    Physical Exam Constitutional:      Appearance: Normal appearance.  HENT:     Mouth/Throat:     Mouth: Mucous membranes are moist.     Pharynx: Oropharynx is clear.  Cardiovascular:     Rate and Rhythm: Normal rate and regular rhythm.  Pulmonary:     Effort: Pulmonary effort is normal.     Breath sounds: No rhonchi or rales.  Abdominal:     General: Abdomen is flat. Bowel sounds are normal. There is no distension.     Palpations: Abdomen is soft.     Tenderness: There is no abdominal tenderness.  Musculoskeletal:        General: Normal range of motion.     Right lower leg: No edema.     Left lower leg:  No edema.  Skin:    General: Skin is warm and dry.     Capillary Refill: Capillary refill takes less than 2 seconds.  Neurological:     General: No focal deficit present.     Mental Status: He is alert and oriented to person, place, and time.  Psychiatric:        Mood and Affect: Mood normal.        Behavior: Behavior normal.        06/27/2023    3:13 PM 06/09/2022   10:17 AM 10/12/2021    3:26 PM  Depression screen PHQ 2/9  Decreased Interest 0 0 0  Down, Depressed, Hopeless 0 0 0  PHQ - 2 Score 0 0 0  Altered sleeping 0 0 0  Tired, decreased energy 0 0 0  Change in appetite 0 0 0  Feeling bad or failure about yourself  0 0 0  Trouble concentrating 0 0 0  Moving slowly or fidgety/restless 0 0 0  Suicidal thoughts 0 0 0  PHQ-9 Score 0 0 0  Difficult doing work/chores Not difficult at all Not difficult at all Not difficult at all       06/27/2023    3:14 PM 10/12/2021  3:26 PM 06/11/2020    3:25 PM  GAD 7 : Generalized Anxiety Score  Nervous, Anxious, on Edge 0 0 0  Control/stop worrying 0 0 0  Worry too much - different things 0 0 0  Trouble relaxing 0 0 0  Restless 0 0 0  Easily annoyed or irritable 0 0 0  Afraid - awful might happen 0 0 0  Total GAD 7 Score 0 0 0  Anxiety Difficulty Not difficult at all Not difficult at all     No results found for any visits on 06/27/23.  Last CBC Lab Results  Component Value Date   WBC 11.3 (H) 09/29/2018   HGB 14.7 09/29/2018   HCT 43.7 09/29/2018   MCV 90.1 09/29/2018   MCH 30.3 09/29/2018   RDW 14.0 09/29/2018   PLT 184 09/29/2018   Last metabolic panel Lab Results  Component Value Date   GLUCOSE 118 (H) 09/29/2018   NA 138 09/29/2018   K 3.6 09/29/2018   CL 106 09/29/2018   CO2 24 09/29/2018   BUN 25 (H) 09/29/2018   CREATININE 1.35 (H) 09/29/2018   GFRNONAA 54 (L) 09/29/2018   CALCIUM 6.7 (L) 09/29/2018   PROT 7.4 09/29/2018   ALBUMIN 3.6 09/29/2018   BILITOT 0.8 09/29/2018   ALKPHOS 97 09/29/2018    AST 16 09/29/2018   ALT 20 09/29/2018   ANIONGAP 8 09/29/2018       The 10-year ASCVD risk score (Arnett DK, et al., 2019) is: 31.7%    Assessment & Plan:  Mixed hyperlipidemia Assessment & Plan: Recent labs in April with elevated total cholesterol and LDL. 10 year ASCVD risk is 31%, he is agreeable to starting a statin. Will check CMP today and start atorvastatin 40 mg daily.  Orders: -     Comprehensive metabolic panel with GFR  Non-seasonal allergic rhinitis due to other allergic trigger Assessment & Plan: Reports allergies to shellfish, ragweed, pollen, and cats.   Using montelukast, cetrizine and nasal corticosteroid which help with allergies.    Essential (primary) hypertension Assessment & Plan: Currently taking amlodipine 10 mg daily, chlorthalidone 15 mg daily, and lisinopril 20 mg daily. Amlodipine was increased at [facility]. BP at that time was 140/70. BP today 142/70 today. He reports compliance with medication. He is asymptomatic today. I have asked him to discuss with PCP if he should increase his lisinopril for increased BP control. May be difficult to control BP given CKD3a and single R kidney.    Other orders -     Atorvastatin Calcium; Take 1 tablet (40 mg total) by mouth daily.  Dispense: 90 tablet; Refill: 3     No follow-ups on file.    Barnetta Liberty, MD

## 2023-06-28 ENCOUNTER — Telehealth: Payer: Self-pay | Admitting: General Practice

## 2023-06-28 ENCOUNTER — Telehealth: Payer: Self-pay | Admitting: Student

## 2023-06-28 ENCOUNTER — Ambulatory Visit: Payer: Self-pay | Admitting: Student

## 2023-06-28 LAB — COMPREHENSIVE METABOLIC PANEL WITH GFR
ALT: 20 IU/L (ref 0–44)
AST: 15 IU/L (ref 0–40)
Albumin: 3.9 g/dL (ref 3.8–4.8)
Alkaline Phosphatase: 125 IU/L — ABNORMAL HIGH (ref 44–121)
BUN/Creatinine Ratio: 19 (ref 10–24)
BUN: 30 mg/dL — ABNORMAL HIGH (ref 8–27)
Bilirubin Total: 0.3 mg/dL (ref 0.0–1.2)
CO2: 25 mmol/L (ref 20–29)
Calcium: 9.8 mg/dL (ref 8.6–10.2)
Chloride: 100 mmol/L (ref 96–106)
Creatinine, Ser: 1.6 mg/dL — ABNORMAL HIGH (ref 0.76–1.27)
Globulin, Total: 3.5 g/dL (ref 1.5–4.5)
Glucose: 145 mg/dL — ABNORMAL HIGH (ref 70–99)
Potassium: 3.8 mmol/L (ref 3.5–5.2)
Sodium: 143 mmol/L (ref 134–144)
Total Protein: 7.4 g/dL (ref 6.0–8.5)
eGFR: 46 mL/min/{1.73_m2} — ABNORMAL LOW (ref >59)

## 2023-06-28 NOTE — Telephone Encounter (Signed)
 Noted  EV

## 2023-06-28 NOTE — Telephone Encounter (Signed)
 Called pt to let him know he left a list of his medications in the office from his apt on 06/27/23. Left pt a voicemail.   EV

## 2023-06-28 NOTE — Telephone Encounter (Signed)
 Copied from CRM #[id number]. Topic: General - Other >> Jun 28, 2023  3:43 PM Lynnie Saucier S wrote: Reason for CRM: Patient states he will come by the office tomorrow to pick up his list of medications. He will also be dropping off a form that needs to be filled out.

## 2023-06-30 ENCOUNTER — Telehealth: Payer: Self-pay

## 2023-06-30 NOTE — Telephone Encounter (Signed)
 Copied from CRM [id number]. Topic: Clinical - Prescription Issue >> Jun 29, 2023  4:26 PM Felizardo Hotter wrote: Reason for CRM: Pt stated he is taking atorvastatin  (LIPITOR) 40 MG tablet and an other statin but does not recall name. Pt wants to know if he is supposed to take both statins. Please call pt at [phone].

## 2023-06-30 NOTE — Telephone Encounter (Signed)
 LMOM for patient to call back.  JM

## 2023-07-03 ENCOUNTER — Telehealth: Payer: Self-pay

## 2023-07-03 MED ORDER — LISINOPRIL 20 MG PO TABS: 20.0000 mg | ORAL_TABLET | Freq: Every day | ORAL | 3 refills | Status: AC

## 2023-07-03 NOTE — Telephone Encounter (Signed)
 Copied from CRM [id number]. Topic: Clinical - Prescription Issue >> Jun 29, 2023  4:26 PM Felizardo Hotter wrote: Reason for CRM: Pt stated he is taking atorvastatin  (LIPITOR) 40 MG tablet and an other statin but does not recall name. Pt wants to know if he is supposed to take both statins. Please call pt at [phone]. >> Jul 03, 2023 11:17 AM Crispin Dolphin wrote: Patient called back. States he was playing phone tag. Per notes provider need to know what medication was so she can let him know which to take. Patient states its just one medication but two different doses says he has a 20 and 40. Let him know 40 is what's listed.

## 2023-07-03 NOTE — Telephone Encounter (Signed)
 Patient spoke with Itzel  JM

## 2023-07-03 NOTE — Telephone Encounter (Signed)
 Spoke with patient, discussed his phone call regarding his statin medication, he stated he received clarification on medication already.   Also discuss a change in medication, patient said he wants to get Rx for Lisinopril 40 mg to 20 mg. Currently taking 20 mg of lisinopril, (cuts 40 mg in half) and does not want to continue cutting the pills in half and would prefer to take a whole pill. Per Dr. Cari Char okay verbal order to order 20 mg tablets, sig: take one tablet daily, 90 day supply, 3 refills.

## 2023-07-03 NOTE — Telephone Encounter (Signed)
 2nd attempt to call patient. LMOM for him to call back.  JM

## 2023-08-02 ENCOUNTER — Ambulatory Visit (INDEPENDENT_AMBULATORY_CARE_PROVIDER_SITE_OTHER): Admitting: Emergency Medicine

## 2023-08-02 VITALS — Ht 70.0 in | Wt 219.0 lb

## 2023-08-02 DIAGNOSIS — Z0000 Encounter for general adult medical examination without abnormal findings: Secondary | ICD-10-CM

## 2023-08-02 NOTE — Progress Notes (Signed)
 Subjective:   Luke Holland is a 73 y.o. who presents for a Medicare Wellness preventive visit.  As a reminder, Annual Wellness Visits don't include a physical exam, and some assessments may be limited, especially if this visit is performed virtually. We may recommend an in-person follow-up visit with your provider if needed.  Visit Complete: Virtual I connected with  Luke Holland on 08/02/23 by a audio enabled telemedicine application and verified that I am speaking with the correct person using two identifiers.  Patient Location: Home  Provider Location: Home Office  I discussed the limitations of evaluation and management by telemedicine. The patient expressed understanding and agreed to proceed.  Vital Signs: Because this visit was a virtual/telehealth visit, some criteria may be missing or patient reported. Any vitals not documented were not able to be obtained and vitals that have been documented are patient reported.  VideoDeclined- This patient declined Librarian, academic. Therefore the visit was completed with audio only.  Persons Participating in Visit: Patient.  AWV Questionnaire: No: Patient Medicare AWV questionnaire was not completed prior to this visit.  Cardiac Risk Factors include: advanced age (>47men, >74 women);male gender;dyslipidemia;hypertension;obesity (BMI >30kg/m2);Other (see comment), Risk factor comments: prediabetic     Objective:    Today's Vitals   08/02/23 1447  Weight: 219 lb (99.3 kg)  Height: 5' 10 (1.778 m)   Body mass index is 31.42 kg/m.     08/02/2023    3:09 PM 06/09/2022   10:19 AM 06/07/2021    2:53 PM 09/29/2018    4:36 AM  Advanced Directives  Does Patient Have a Medical Advance Directive? No No No No  Would patient like information on creating a medical advance directive? No - Patient declined No - Patient declined Yes (MAU/Ambulatory/Procedural Areas - Information given)     Current Medications  (verified) Outpatient Encounter Medications as of 08/02/2023  Medication Sig   amLODipine (NORVASC) 10 MG tablet TAKE ONE-HALF TABLET BY MOUTH EVERY DAY FOR BLOOD PRESSURE (Patient taking differently: Takes 1 tablet daily)   aspirin 81 MG EC tablet Take 1 tablet by mouth daily.   atorvastatin  (LIPITOR) 40 MG tablet Take 1 tablet (40 mg total) by mouth daily.   brimonidine (ALPHAGAN) 0.2 % ophthalmic solution 1 drop 3 (three) times daily.   Calcium  Carb-Cholecalciferol 600-400 MG-UNIT TABS Take by mouth.   carboxymethylcellulose (REFRESH PLUS) 0.5 % SOLN Apply to eye.   cetirizine (ZYRTEC) 10 MG tablet TAKE ONE TABLET BY MOUTH ONCE EVERY DAY AS NEEDED   chlorthalidone (HYGROTON) 25 MG tablet Take 1 tablet by mouth daily.   dorzolamide-timolol (COSOPT) 22.3-6.8 MG/ML ophthalmic solution INSTILL 1 DROP DORZOLAMIDE 2/TIMOLOL 0.5% OPH SOLN IN LEFT EYE TWO TIMES A DAY   fluticasone (FLONASE) 50 MCG/ACT nasal spray Place into the nose.   latanoprost (XALATAN) 0.005 % ophthalmic solution 1 drop at bedtime.   lisinopril  (ZESTRIL ) 20 MG tablet Take 1 tablet (20 mg total) by mouth daily.   montelukast (SINGULAIR) 10 MG tablet TAKE ONE TABLET BY MOUTH EVERY DAY FOR ASTHMA   acetaminophen (TYLENOL) 325 MG tablet Take 650 mg by mouth every 6 (six) hours as needed. (Patient not taking: Reported on 08/02/2023)   lisinopril  (ZESTRIL ) 40 MG tablet TAKE ONE-HALF TABLET BY MOUTH EVERY DAY FOR BLOOD PRESSURE (Patient not taking: Reported on 08/02/2023)   No facility-administered encounter medications on file as of 08/02/2023.    Allergies (verified) Rye grass flower pollen extract [gramineae pollens]   History: Past Medical History:  Diagnosis Date   Cancer [facility])    renal cancer   Glaucoma    Hypertension    Renal disorder    patient states that he has one kidney   Thyroid disease    Past Surgical History:  Procedure Laterality Date   NEPHRECTOMY     PARATHYROIDECTOMY     Family History  Problem  Relation Age of Onset   COPD Father    Social History   Socioeconomic History   Marital status: Married    Spouse name: Tess   Number of children: 2   Years of education: Not on file   Highest education level: Not on file  Occupational History   Occupation: retired  Tobacco Use   Smoking status: Never   Smokeless tobacco: Never  Vaping Use   Vaping status: Never Used  Substance and Sexual Activity   Alcohol use: Yes    Comment: rare, once per year   Drug use: Never   Sexual activity: Not Currently  Other Topics Concern   Not on file  Social History Narrative   Not on file   Social Drivers of Health   Financial Resource Strain: Low Risk  (08/02/2023)   Overall Financial Resource Strain (CARDIA)    Difficulty of Paying Living Expenses: Not hard at all  Food Insecurity: No Food Insecurity (08/02/2023)   Hunger Vital Sign    Worried About Running Out of Food in the Last Year: Never true    Ran Out of Food in the Last Year: Never true  Transportation Needs: No Transportation Needs (08/02/2023)   PRAPARE - Administrator, Civil Service (Medical): No    Lack of Transportation (Non-Medical): No  Physical Activity: Insufficiently Active (08/02/2023)   Exercise Vital Sign    Days of Exercise per Week: 1 day    Minutes of Exercise per Session: 20 min  Stress: No Stress Concern Present (08/02/2023)   [employer] of Occupational Health - Occupational Stress Questionnaire    Feeling of Stress: Only a little  Social Connections: Moderately Isolated (08/02/2023)   Social Connection and Isolation Panel    Frequency of Communication with Friends and Family: Twice a week    Frequency of Social Gatherings with Friends and Family: Once a week    Attends Religious Services: Never    Database administrator or Organizations: No    Attends Engineer, structural: Never    Marital Status: Married    Tobacco Counseling Counseling given: Not Answered    Clinical  Intake:  Pre-visit preparation completed: Yes  Pain : No/denies pain     BMI - recorded: 31.42 Nutritional Status: BMI > 30  Obese Nutritional Risks: None Diabetes: No  Lab Results  Component Value Date   HGBA1C 5.9 04/12/2023     How often do you need to have someone help you when you read instructions, pamphlets, or other written materials from your doctor or pharmacy?: 1 - Never  Interpreter Needed?: No  Information entered by :: Vina Ned, CMA   Activities of Daily Living     08/02/2023    2:49 PM  In your present state of health, do you have any difficulty performing the following activities:  Hearing? 1  Comment wears hearing aids  Vision? 1  Comment due to glaucoma  Difficulty concentrating or making decisions? 0  Walking or climbing stairs? 0  Dressing or bathing? 0  Doing errands, shopping? 0  Preparing Food and eating ? N  Using the Toilet? N  In the past six months, have you accidently leaked urine? N  Do you have problems with loss of bowel control? N  Managing your Medications? N  Managing your Finances? N  Housekeeping or managing your Housekeeping? N    Patient Care Team: [facility], [facility] as PCP - General (General Practice) Lemon Raisin, MD (Internal Medicine)  I have updated your Care Teams any recent Medical Services you may have received from other providers in the past year.     Assessment:   This is a routine wellness examination for Luke Holland.  Hearing/Vision screen Hearing Screening - Comments:: Wears hearing aids Vision Screening - Comments:: Gets routine eye exam, [place], [place]    Goals Addressed             This Visit's Progress    Patient Stated       Join a gym and exercise more       Depression Screen     08/02/2023    3:06 PM 06/27/2023    3:13 PM 06/09/2022   10:17 AM 10/12/2021    3:26 PM 06/07/2021    2:52 PM 06/11/2020    3:25 PM  PHQ 2/9 Scores  PHQ - 2 Score 0 0 0 0 0 0  PHQ- 9 Score 0 0 0  0  2    Fall Risk     08/02/2023    3:10 PM 06/27/2023    3:13 PM 06/09/2022   10:19 AM 10/12/2021    3:26 PM 06/07/2021    2:56 PM  Fall Risk   Falls in the past year? 0 0 0 0 0  Number falls in past yr: 0 0 0 0 0  Injury with Fall? 0 0 0 0 0  Risk for fall due to : No Fall Risks  No Fall Risks No Fall Risks No Fall Risks  Follow up Falls evaluation completed  Falls prevention discussed;Falls evaluation completed Falls evaluation completed  Falls prevention discussed      Data saved with a previous flowsheet row definition    MEDICARE RISK AT HOME:  Medicare Risk at Home Any stairs in or around the home?: Yes If so, are there any without handrails?: No Home free of loose throw rugs in walkways, pet beds, electrical cords, etc?: Yes Adequate lighting in your home to reduce risk of falls?: Yes Life alert?: No Use of a cane, walker or w/c?: No Grab bars in the bathroom?: No Shower chair or bench in shower?: No Elevated toilet seat or a handicapped toilet?: No  TIMED UP AND GO:  Was the test performed?  No  Cognitive Function: 6CIT completed        08/02/2023    3:11 PM 06/09/2022   10:23 AM  6CIT Screen  What Year? 0 points 0 points  What month? 0 points 0 points  What time? 0 points 0 points  Count back from 20 0 points 0 points  Months in reverse 2 points 0 points  Repeat phrase 0 points 0 points  Total Score 2 points 0 points    Immunizations Immunization History  Administered Date(s) Administered   Fluad Quad(high Dose 65+) 11/06/2019, 01/14/2021   Fluad Trivalent(High Dose 65+) 12/05/2022   Influenza, High Dose Seasonal PF 01/14/2021, 11/28/2021, 11/28/2021   Influenza, Seasonal, Injecte, Preservative Fre 11/25/2011, 12/10/2012, 12/17/2013, 12/24/2014   Influenza,inj,Quad PF,6+ Mos 11/30/2015   Influenza-Unspecified 09/17/2016, 12/06/2017   PFIZER Comirnaty(Gray Top)Covid-19 Tri-Sucrose Vaccine 05/07/2020  PFIZER(Purple Top)SARS-COV-2 Vaccination  02/24/2019, 03/17/2019, 10/29/2019   Pfizer Covid-19 Vaccine Bivalent Booster 8yrs & up 01/06/2021   Pfizer(Comirnaty)Fall Seasonal Vaccine 12 years and older 11/29/2021, 01/02/2023   Pneumococcal Conjugate-13 07/01/2016   Pneumococcal Polysaccharide-23 09/12/2012, 02/09/2018   Rsv, Bivalent, Protein Subunit Rsvpref,pf [place]) 04/10/2023   Tdap 11/18/2011, 04/12/2022   Zoster Recombinant(Shingrix) 04/06/2021, 07/06/2021   Zoster, Live 06/08/2015    Screening Tests Health Maintenance  Topic Date Due   Hepatitis C Screening  Never done   COVID-19 Vaccine (8 - Pfizer risk 2024-25 season) 07/03/2023   INFLUENZA VACCINE  08/18/2023   Medicare Annual Wellness (AWV)  08/01/2024   Colonoscopy  07/20/2026   DTaP/Tdap/Td (3 - Td or Tdap) 04/11/2032   Pneumococcal Vaccine: 50+ Years  Completed   Zoster Vaccines- Shingrix  Completed   Hepatitis B Vaccines  Aged Out   HPV VACCINES  Aged Out   Meningococcal B Vaccine  Aged Out    Health Maintenance  Health Maintenance Due  Topic Date Due   Hepatitis C Screening  Never done   COVID-19 Vaccine (8 - Pfizer risk 2024-25 season) 07/03/2023   Health Maintenance Items Addressed: See Nurse Notes at the end of this note  Additional Screening:  Vision Screening: Recommended annual ophthalmology exams for early detection of glaucoma and other disorders of the eye. Would you like a referral to an eye doctor? No    Dental Screening: Recommended annual dental exams for proper oral hygiene  Community Resource Referral / Chronic Care Management: CRR required this visit?  No   CCM required this visit?  No   Plan:    I have personally reviewed and noted the following in the patient's chart:   Medical and social history Use of alcohol, tobacco or illicit drugs  Current medications and supplements including opioid prescriptions. Patient is not currently taking opioid prescriptions. Functional ability and status Nutritional  status Physical activity Advanced directives List of other physicians Hospitalizations, surgeries, and ER visits in previous 12 months Vitals Screenings to include cognitive, depression, and falls Referrals and appointments  In addition, I have reviewed and discussed with patient certain preventive protocols, quality metrics, and best practice recommendations. A written personalized care plan for preventive services as well as general preventive health recommendations were provided to patient.   Vina Ned, CMA   08/02/2023   After Visit Summary: (Mail) Due to this being a telephonic visit, the after visit summary with patients personalized plan was offered to patient via mail   Notes:  6 CIT Score - 2 Covid and flu vaccines in the fall

## 2023-08-02 NOTE — Patient Instructions (Signed)
 [name] , Thank you for taking time out of your busy schedule to complete your Annual Wellness Visit with me. I enjoyed our conversation and look forward to speaking with you again next year. I, as well as your care team,  appreciate your ongoing commitment to your health goals. Please review the following plan we discussed and let me know if I can assist you in the future. Your Game plan/ To Do List    Referrals: None   Follow up Visits: Next Medicare AWV with our clinical staff: 08/07/24 @ 2:40pm (PHONE VISIT)   Have you seen your provider in the last 6 months (3 months if uncontrolled diabetes)? Yes Next Office Visit with your provider: 12/27/23 @ 1:20pm with Dr. Lemon  Clinician Recommendations:  Aim for 30 minutes of exercise or brisk walking, 6-8 glasses of water, and 5 servings of fruits and vegetables each day. Get the Covid and flu vaccines in the fall.      This is a list of the screening recommended for you and due dates:  Health Maintenance  Topic Date Due   Hepatitis C Screening  Never done   COVID-19 Vaccine (8 - Pfizer risk 2024-25 season) 07/03/2023   Flu Shot  08/18/2023   Medicare Annual Wellness Visit  08/01/2024   Colon Cancer Screening  07/20/2026   DTaP/Tdap/Td vaccine (3 - Td or Tdap) 04/11/2032   Pneumococcal Vaccine for age over 35  Completed   Zoster (Shingles) Vaccine  Completed   Hepatitis B Vaccine  Aged Out   HPV Vaccine  Aged Out   Meningitis B Vaccine  Aged Out    Advanced directives: (Declined) Advance directive discussed with you today. Even though you declined this today, please call our office should you change your mind, and we can give you the proper paperwork for you to fill out. Advance Care Planning is important because it:  [x]  Makes sure you receive the medical care that is consistent with your values, goals, and preferences  [x]  It provides guidance to your family and loved ones and reduces their decisional burden about whether or not  they are making the right decisions based on your wishes.  Follow the link provided in your after visit summary or read over the paperwork we have mailed to you to help you started getting your Advance Directives in place. If you need assistance in completing these, please reach out to us  so that we can help you!  See attachments for Preventive Care and Fall Prevention Tips.   Fall Prevention in the Home, Adult Falls can cause injuries and affect people of all ages. There are many simple things that you can do to make your home safe and to help prevent falls. If you need it, ask for help making these changes. What actions can I take to prevent falls? General information Use good lighting in all rooms. Make sure to: Replace any light bulbs that burn out. Turn on lights if it is dark and use night-lights. Keep items that you use often in easy-to-reach places. Lower the shelves around your home if needed. Move furniture so that there are clear paths around it. Do not keep throw rugs or other things on the floor that can make you trip. If any of your floors are uneven, fix them. Add color or contrast paint or tape to clearly mark and help you see: Grab bars or handrails. First and last steps of staircases. Where the edge of each step is. If you  use a ladder or stepladder: Make sure that it is fully opened. Do not climb a closed ladder. Make sure the sides of the ladder are locked in place. Have someone hold the ladder while you use it. Know where your pets are as you move through your home. What can I do in the bathroom?     Keep the floor dry. Clean up any water that is on the floor right away. Remove soap buildup in the bathtub or shower. Buildup makes bathtubs and showers slippery. Use non-skid mats or decals on the floor of the bathtub or shower. Attach bath mats securely with double-sided, non-slip rug tape. If you need to sit down while you are in the shower, use a non-slip  stool. Install grab bars by the toilet and in the bathtub and shower. Do not use towel bars as grab bars. What can I do in the bedroom? Make sure that you have a light by your bed that is easy to reach. Do not use any sheets or blankets on your bed that hang to the floor. Have a firm bench or chair with side arms that you can use for support when you get dressed. What can I do in the kitchen? Clean up any spills right away. If you need to reach something above you, use a sturdy step stool that has a grab bar. Keep electrical cables out of the way. Do not use floor polish or wax that makes floors slippery. What can I do with my stairs? Do not leave anything on the stairs. Make sure that you have a light switch at the top and the bottom of the stairs. Have them installed if you do not have them. Make sure that there are handrails on both sides of the stairs. Fix handrails that are broken or loose. Make sure that handrails are as long as the staircases. Install non-slip stair treads on all stairs in your home if they do not have carpet. Avoid having throw rugs at the top or bottom of stairs, or secure the rugs with carpet tape to prevent them from moving. Choose a carpet design that does not hide the edge of steps on the stairs. Make sure that carpet is firmly attached to the stairs. Fix any carpet that is loose or worn. What can I do on the outside of my home? Use bright outdoor lighting. Repair the edges of walkways and driveways and fix any cracks. Clear paths of anything that can make you trip, such as tools or rocks. Add color or contrast paint or tape to clearly mark and help you see high doorway thresholds. Trim any bushes or trees on the main path into your home. Check that handrails are securely fastened and in good repair. Both sides of all steps should have handrails. Install guardrails along the edges of any raised decks or porches. Have leaves, snow, and ice cleared regularly. Use  sand, salt, or ice melt on walkways during winter months if you live where there is ice and snow. In the garage, clean up any spills right away, including grease or oil spills. What other actions can I take? Review your medicines with your health care provider. Some medicines can make you confused or feel dizzy. This can increase your chance of falling. Wear closed-toe shoes that fit well and support your feet. Wear shoes that have rubber soles and low heels. Use a cane, walker, scooter, or crutches that help you move around if needed. Talk with your provider  about other ways that you can decrease your risk of falls. This may include seeing a physical therapist to learn to do exercises to improve movement and strength. Where to find more information Centers for Disease Control and Prevention, STEADI: TonerPromos.no [employer] on Aging: BaseRingTones.pl National Institute on Aging: BaseRingTones.pl Contact a health care provider if: You are afraid of falling at home. You feel weak, drowsy, or dizzy at home. You fall at home. Get help right away if you: Lose consciousness or have trouble moving after a fall. Have a fall that causes a head injury. These symptoms may be an emergency. Get help right away. Call 911. Do not wait to see if the symptoms will go away. Do not drive yourself to the hospital. This information is not intended to replace advice given to you by your health care provider. Make sure you discuss any questions you have with your health care provider. Document Revised: 09/06/2021 Document Reviewed: 09/06/2021 Elsevier Patient Education  2024 [employer].

## 2023-11-22 ENCOUNTER — Telehealth: Payer: Self-pay | Admitting: Pharmacist

## 2023-11-22 NOTE — Progress Notes (Signed)
   11/22/2023  Patient ID: Luke Holland, male   DOB: March 01, 1950, 73 y.o.   MRN: [id number]  This patient is appearing on a report for the adherence measure for hypertension (ACEi/ARB) medications this calendar year.   Medication: lisinopril  20 mg Last fill date: 07/03/2023 for 90 day supply  Left voicemail for patient to return my call at their convenience.  Sharyle Sia, PharmD, [facility] [phone]

## 2023-11-29 ENCOUNTER — Other Ambulatory Visit: Payer: Self-pay | Admitting: Pharmacist

## 2023-11-29 DIAGNOSIS — I10 Essential (primary) hypertension: Secondary | ICD-10-CM

## 2023-11-29 NOTE — Progress Notes (Signed)
   11/29/2023  Patient ID: Luke Holland, male   DOB: Sep 10, 1950, 73 y.o.   MRN: [id number]  Receive a call from patient returning my call regarding medication adherence.  This patient is appearing on a report for the adherence measure for hypertension (ACEi/ARB) medications this calendar year.   Medication: lisinopril  20 mg Last fill date: 07/03/2023 for 90 day supply  Today patient shares that he has been taking Lisinopril  20 mg -  tablet (10 mg) daily   Hypertension:  Current medications:  - amlodipine 10 mg daily - chlorthalidone 25 mg daily - lisinopril  20 mg - Reports taking  tablet (10 mg) daily Reports self-reduced to this dose due to concern about kidney protection (notes that he only has one kidney) if taking too much medication  Patient has an automated, upper arm home BP cuff Denies checking at home recently  Admits to eating foods high in sodium (take out, processed foods)  Patient denies hypotensive s/sx including dizziness, lightheadedness.    Lab Results  Component Value Date   NA 143 06/27/2023   CL 100 06/27/2023   K 3.8 06/27/2023   CO2 25 06/27/2023   BUN 30 (H) 06/27/2023   CREATININE 1.60 (H) 06/27/2023   EGFR 46 (L) 06/27/2023   CALCIUM  9.8 06/27/2023   ALBUMIN 3.9 06/27/2023   GLUCOSE 145 (H) 06/27/2023   BP Readings from Last 3 Encounters:  06/27/23 (!) 142/70  06/09/22 (!) 148/74  10/12/21 138/80   Pulse Readings from Last 3 Encounters:  06/27/23 71  10/12/21 80  06/11/20 78    Hypertension: - Reviewed long term renal outcomes of uncontrolled blood pressure - Discuss impact of salt/sodium on blood pressure and encourage patient to reduce salt/sodium intake - Recommend to restart monitoring home blood pressure, keep log of results and have this record to review at upcoming medical appointment with PCP on 12/27/2023. Patient to contact provider office sooner if needed for readings outside of established parameters or symptoms - Will  send message to PCP to provide an update  Sharyle Sia, PharmD, [facility] [phone]

## 2023-11-30 NOTE — Patient Instructions (Signed)
Check your blood pressure twice weekly, and any time you have concerning symptoms like headache, chest pain, dizziness, shortness of breath, or vision changes.   Our goal is less than 130/80.  To appropriately check your blood pressure, make sure you do the following:  1) Avoid caffeine, exercise, or tobacco products for 30 minutes before checking. Empty your bladder. 2) Sit with your back supported in a flat-backed chair. Rest your arm on something flat (arm of the chair, table, etc). 3) Sit still with your feet flat on the floor, resting, for at least 5 minutes.  4) Check your blood pressure. Take 1-2 readings.  5) Write down these readings and bring with you to any provider appointments.  Bring your home blood pressure machine with you to a provider's office for accuracy comparison at least once a year.   Make sure you take your blood pressure medications before you come to any office visit, even if you were asked to fast for labs.  Estelle Grumbles, PharmD, [facility] [phone]

## 2023-12-27 ENCOUNTER — Ambulatory Visit: Admitting: Student

## 2023-12-27 ENCOUNTER — Encounter: Payer: Self-pay | Admitting: Student

## 2023-12-27 VITALS — BP 122/76 | HR 86 | Ht 70.0 in | Wt 220.0 lb

## 2023-12-27 DIAGNOSIS — I10 Essential (primary) hypertension: Secondary | ICD-10-CM | POA: Diagnosis not present

## 2023-12-27 DIAGNOSIS — E782 Mixed hyperlipidemia: Secondary | ICD-10-CM

## 2023-12-27 NOTE — Progress Notes (Signed)
 Established Patient Office Visit  Subjective   Patient ID: Luke Holland, male    DOB: 1950/08/25  Age: 73 y.o. MRN: [id number]  Chief Complaint  Patient presents with   Hyperlipidemia    Luke Holland is a 73 y.o. person with medical hx listed below who presents today for hyperlipidemia follow up. Feeling well today. No acute complaints today. Please refer to problem based charting for further details and assessment and plan of current problem and chronic medical conditions.   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   amLODipine (NORVASC) 10 MG tablet TAKE ONE-HALF TABLET BY MOUTH EVERY DAY FOR BLOOD PRESSURE (Patient taking differently: Takes 1 tablet daily)   aspirin 81 MG EC tablet Take 1 tablet by mouth daily.   atorvastatin  (LIPITOR) 40 MG tablet Take 1 tablet (40 mg total) by mouth daily.   brimonidine (ALPHAGAN) 0.2 % ophthalmic solution 1 drop 3 (three) times daily.   Calcium  Carb-Cholecalciferol 600-400 MG-UNIT TABS Take by mouth.   carboxymethylcellulose (REFRESH PLUS) 0.5 % SOLN Apply to eye.   chlorthalidone (HYGROTON) 25 MG tablet Take 1 tablet by mouth daily.   dorzolamide-timolol (COSOPT)  22.3-6.8 MG/ML ophthalmic solution INSTILL 1 DROP DORZOLAMIDE 2/TIMOLOL 0.5% OPH SOLN IN LEFT EYE TWO TIMES A DAY   fluticasone (FLONASE) 50 MCG/ACT nasal spray Place into the nose.   latanoprost (XALATAN) 0.005 % ophthalmic solution 1 drop at bedtime.   lisinopril  (ZESTRIL ) 20 MG tablet Take 1 tablet (20 mg total) by mouth daily. (Patient taking differently: Take 10 mg by mouth daily.)   montelukast (SINGULAIR) 10 MG tablet TAKE ONE TABLET BY MOUTH EVERY DAY FOR ASTHMA   RHOPRESSA 0.02 % SOLN SMARTSIG:1 Drop(s) Left Eye Every Evening   cetirizine (ZYRTEC) 10 MG tablet TAKE ONE TABLET BY MOUTH ONCE EVERY DAY AS NEEDED (Patient not taking: Reported on 12/27/2023)   No facility-administered encounter medications on file as of 12/27/2023.    BP 122/76   Pulse 86   Ht 5' 10 (1.778 m)   Wt 220 lb (99.8 kg)   SpO2 95%   BMI 31.57 kg/m  BP Readings from Last 3 Encounters:  12/27/23 122/76  06/27/23 (!) 142/70  06/09/22 (!) 148/74    Physical Exam Constitutional:      Appearance: Normal appearance.  HENT:     Nose: No congestion or rhinorrhea.     Mouth/Throat:     Mouth: Mucous membranes are moist.     Pharynx: Oropharynx is clear.  Cardiovascular:     Rate and Rhythm: Normal rate and regular rhythm.  Pulmonary:     Effort: Pulmonary effort is normal.     Breath sounds: No rhonchi or rales.  Abdominal:  General: Abdomen is flat. Bowel sounds are normal. There is no distension.     Palpations: Abdomen is soft.     Tenderness: There is no abdominal tenderness.  Musculoskeletal:        General: Normal range of motion.     Right lower leg: No edema.     Left lower leg: No edema.  Skin:    General: Skin is warm and dry.     Capillary Refill: Capillary refill takes less than 2 seconds.  Neurological:     General: No focal deficit present.     Mental Status: He is alert and oriented to person, place, and time.  Psychiatric:        Mood and Affect: Mood normal.         Behavior: Behavior normal.        12/27/2023    1:35 PM 08/02/2023    3:06 PM 06/27/2023    3:13 PM  Depression screen PHQ 2/9  Decreased Interest 0 0 0  Down, Depressed, Hopeless 0 0 0  PHQ - 2 Score 0 0 0  Altered sleeping  0 0  Tired, decreased energy  0 0  Change in appetite  0 0  Feeling bad or failure about yourself   0 0  Trouble concentrating  0 0  Moving slowly or fidgety/restless  0 0  Suicidal thoughts  0 0  PHQ-9 Score  0  0   Difficult doing work/chores  Not difficult at all Not difficult at all     Data saved with a previous flowsheet row definition       12/27/2023    1:35 PM 06/27/2023    3:14 PM 10/12/2021    3:26 PM 06/11/2020    3:25 PM  GAD 7 : Generalized Anxiety Score  Nervous, Anxious, on Edge 0 0 0 0  Control/stop worrying 0 0 0 0  Worry too much - different things  0 0 0  Trouble relaxing  0 0 0  Restless  0 0 0  Easily annoyed or irritable  0 0 0  Afraid - awful might happen  0 0 0  Total GAD 7 Score  0 0 0  Anxiety Difficulty  Not difficult at all Not difficult at all     No results found for any visits on 12/27/23.  Last CBC Lab Results  Component Value Date   WBC 8.5 04/12/2023   HGB 15.6 04/12/2023   HCT 45 04/12/2023   MCV 90.1 09/29/2018   MCH 30.3 09/29/2018   RDW 14.0 09/29/2018   PLT 225 04/12/2023   Last metabolic panel Lab Results  Component Value Date   GLUCOSE 145 (H) 06/27/2023   NA 143 06/27/2023   K 3.8 06/27/2023   CL 100 06/27/2023   CO2 25 06/27/2023   BUN 30 (H) 06/27/2023   CREATININE 1.60 (H) 06/27/2023   EGFR 46 (L) 06/27/2023   CALCIUM  9.8 06/27/2023   PROT 7.4 06/27/2023   ALBUMIN 3.9 06/27/2023   LABGLOB 3.5 06/27/2023   BILITOT 0.3 06/27/2023   ALKPHOS 125 (H) 06/27/2023   AST 15 06/27/2023   ALT 20 06/27/2023   ANIONGAP 8 09/29/2018   Last lipids Lab Results  Component Value Date   CHOL 234 (A) 04/12/2023   HDL 36 04/12/2023   LDLCALC 115 04/12/2023   TRIG 396 (A) 04/12/2023   Last  hemoglobin A1c Lab Results  Component Value Date   HGBA1C 5.9 04/12/2023    The  10-year ASCVD risk score (Arnett DK, et al., 2019) is: 26.4%* (Cholesterol units were assumed)    Assessment & Plan:  Mixed hyperlipidemia Assessment & Plan: Tolerating atorvastatin  40 mg well. Not fasting today. Will check lipid   Orders: -     Lipid panel  Essential (primary) hypertension Assessment & Plan: BP is well controlled  on current medication. Doing well on current medication.       Harlene Saddler, MD

## 2023-12-27 NOTE — Assessment & Plan Note (Signed)
 Tolerating atorvastatin  40 mg well. Not fasting today. Will check lipid

## 2023-12-27 NOTE — Assessment & Plan Note (Addendum)
 BP is well controlled  on current medication. Doing well on current medication.

## 2023-12-28 ENCOUNTER — Ambulatory Visit: Payer: Self-pay | Admitting: Student

## 2023-12-28 LAB — LIPID PANEL
Chol/HDL Ratio: 3.8 ratio (ref 0.0–5.0)
Cholesterol, Total: 148 mg/dL (ref 100–199)
HDL: 39 mg/dL — ABNORMAL LOW (ref >39)
LDL Chol Calc (NIH): 75 mg/dL (ref 0–99)
Triglycerides: 205 mg/dL — ABNORMAL HIGH (ref 0–149)
VLDL Cholesterol Cal: 34 mg/dL (ref 5–40)

## 2024-08-07 ENCOUNTER — Ambulatory Visit
